# Patient Record
Sex: Female | Born: 1995 | Race: Black or African American | Hispanic: No | Marital: Single | Smoking: Never smoker
Health system: Southern US, Community
[De-identification: ages and names within clinical notes are randomized; demographics above are authoritative.]

## PROBLEM LIST (undated history)

## (undated) DIAGNOSIS — L659 Nonscarring hair loss, unspecified: Secondary | ICD-10-CM

## (undated) DIAGNOSIS — R6252 Short stature (child): Secondary | ICD-10-CM

## (undated) DIAGNOSIS — J309 Allergic rhinitis, unspecified: Secondary | ICD-10-CM

## (undated) HISTORY — DX: Short stature (child): R62.52

## (undated) HISTORY — DX: Nonscarring hair loss, unspecified: L65.9

## (undated) HISTORY — DX: Allergic rhinitis, unspecified: J30.9

## (undated) HISTORY — PX: OTHER SURGICAL HISTORY: SHX169

---
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
## 2017-01-01 ENCOUNTER — Ambulatory Visit (INDEPENDENT_AMBULATORY_CARE_PROVIDER_SITE_OTHER): Payer: BLUE CROSS/BLUE SHIELD | Admitting: *Deleted

## 2017-01-01 DIAGNOSIS — J309 Allergic rhinitis, unspecified: Secondary | ICD-10-CM

## 2017-01-11 ENCOUNTER — Ambulatory Visit (INDEPENDENT_AMBULATORY_CARE_PROVIDER_SITE_OTHER): Payer: BLUE CROSS/BLUE SHIELD

## 2017-01-11 DIAGNOSIS — J309 Allergic rhinitis, unspecified: Secondary | ICD-10-CM | POA: Diagnosis not present

## 2017-01-15 ENCOUNTER — Ambulatory Visit (INDEPENDENT_AMBULATORY_CARE_PROVIDER_SITE_OTHER): Payer: BLUE CROSS/BLUE SHIELD | Admitting: *Deleted

## 2017-01-15 DIAGNOSIS — J309 Allergic rhinitis, unspecified: Secondary | ICD-10-CM | POA: Diagnosis not present

## 2017-01-25 ENCOUNTER — Ambulatory Visit (INDEPENDENT_AMBULATORY_CARE_PROVIDER_SITE_OTHER): Payer: BLUE CROSS/BLUE SHIELD

## 2017-01-25 DIAGNOSIS — J309 Allergic rhinitis, unspecified: Secondary | ICD-10-CM

## 2017-02-21 ENCOUNTER — Ambulatory Visit (INDEPENDENT_AMBULATORY_CARE_PROVIDER_SITE_OTHER): Payer: BLUE CROSS/BLUE SHIELD | Admitting: *Deleted

## 2017-02-21 DIAGNOSIS — J309 Allergic rhinitis, unspecified: Secondary | ICD-10-CM | POA: Diagnosis not present

## 2017-03-21 ENCOUNTER — Ambulatory Visit (INDEPENDENT_AMBULATORY_CARE_PROVIDER_SITE_OTHER): Payer: BLUE CROSS/BLUE SHIELD | Admitting: *Deleted

## 2017-03-21 DIAGNOSIS — J309 Allergic rhinitis, unspecified: Secondary | ICD-10-CM | POA: Diagnosis not present

## 2017-04-24 ENCOUNTER — Encounter: Payer: Self-pay | Admitting: *Deleted

## 2017-04-24 NOTE — Progress Notes (Signed)
VIALS MADE. EXP: 04-24-18. HV 

## 2017-05-01 DIAGNOSIS — J301 Allergic rhinitis due to pollen: Secondary | ICD-10-CM | POA: Diagnosis not present

## 2017-05-10 ENCOUNTER — Ambulatory Visit (INDEPENDENT_AMBULATORY_CARE_PROVIDER_SITE_OTHER): Payer: 59

## 2017-05-10 DIAGNOSIS — J309 Allergic rhinitis, unspecified: Secondary | ICD-10-CM

## 2017-06-14 ENCOUNTER — Ambulatory Visit (INDEPENDENT_AMBULATORY_CARE_PROVIDER_SITE_OTHER): Payer: 59

## 2017-06-14 DIAGNOSIS — J309 Allergic rhinitis, unspecified: Secondary | ICD-10-CM

## 2017-07-18 ENCOUNTER — Ambulatory Visit (INDEPENDENT_AMBULATORY_CARE_PROVIDER_SITE_OTHER): Payer: 59 | Admitting: *Deleted

## 2017-07-18 DIAGNOSIS — J309 Allergic rhinitis, unspecified: Secondary | ICD-10-CM

## 2017-07-23 ENCOUNTER — Encounter: Payer: Self-pay | Admitting: Allergy and Immunology

## 2017-07-23 ENCOUNTER — Ambulatory Visit: Payer: 59 | Admitting: Allergy and Immunology

## 2017-07-23 VITALS — BP 112/76 | HR 72 | Resp 16

## 2017-07-23 DIAGNOSIS — J3089 Other allergic rhinitis: Secondary | ICD-10-CM | POA: Diagnosis not present

## 2017-07-23 NOTE — Patient Instructions (Addendum)
°  1.  Continue immunotherapy (and EpiPen) ° °2.  Return to clinic in 1 year or earlier if problem °

## 2017-07-23 NOTE — Progress Notes (Signed)
     Follow-up Note  Referring Provider: Duard BradyPudlo, Ronald J, MD Primary Provider: Duard BradyPudlo, Ronald J, MD Date of Office Visit: 07/23/2017  Subjective:   Alla GermanBryanna L Diaz (DOB: [birth year]) is a 22 y.o. female who [facility] in re-evaluation of the following:  HPI: Marissa Diaz presents to this clinic in reevaluation of her allergic rhinoconjunctivitis treated with immunotherapy.  She was last seen in this clinic over a year ago.  She has really done well and has completely eliminated all upper airway and eye issues.  Currently her immunotherapy is every 4 weeks.  She has not had any adverse effect secondary to the use of this treatment.  Allergies as of 07/23/2017   No Known Allergies     Medication List      EPIPEN 2-PAK 0.3 mg/0.3 mL Soaj injection Generic drug:  EPINEPHrine Inject into the muscle as needed.       History reviewed. No pertinent past medical history.  History reviewed. No pertinent surgical history.  Review of systems negative except as noted in HPI / PMHx or noted below:  Review of Systems  Constitutional: Negative.   HENT: Negative.   Eyes: Negative.   Respiratory: Negative.   Cardiovascular: Negative.   Gastrointestinal: Negative.   Genitourinary: Negative.   Musculoskeletal: Negative.   Skin: Negative.   Neurological: Negative.   Endo/Heme/Allergies: Negative.   Psychiatric/Behavioral: Negative.      Objective:   Vitals:   07/23/17 1636  BP: 112/76  Pulse: 72  Resp: 16          Physical Exam  Constitutional: She is well-developed, well-nourished, and in no distress.  HENT:  Head: Normocephalic.  Right Ear: Tympanic membrane, external ear and ear canal normal.  Left Ear: Tympanic membrane, external ear and ear canal normal.  Nose: Nose normal. No mucosal edema or rhinorrhea.  Mouth/Throat: Uvula is midline, oropharynx is clear and moist and mucous membranes are normal. No oropharyngeal exudate.    Eyes: Conjunctivae are normal.  Neck: Trachea normal. No tracheal tenderness present. No tracheal deviation present. No thyromegaly present.  Cardiovascular: Normal rate, regular rhythm, S1 normal, S2 normal and normal heart sounds.  No murmur heard. Pulmonary/Chest: Breath sounds normal. No stridor. No respiratory distress. She has no wheezes. She has no rales.  Musculoskeletal: She exhibits no edema.  Lymphadenopathy:       Head (right side): No tonsillar adenopathy present.       Head (left side): No tonsillar adenopathy present.    She has no cervical adenopathy.  Neurological: She is alert. Gait normal.  Skin: No rash noted. She is not diaphoretic. No erythema. Nails show no clubbing.  Psychiatric: Mood and affect normal.    Diagnostics: none  Assessment and Plan:   1. Other allergic rhinitis     1.  Continue immunotherapy and EpiPen  2.  Return to clinic in 1 year or earlier if problem  Marissa Diaz appears to be doing quite well and she will continue on immunotherapy at this point in time and I will see her back in this clinic in 1 year or earlier if there is a problem.  Laurette SchimkeEric Evelyne Makepeace, MD Allergy / Immunology [facility]

## 2017-07-24 ENCOUNTER — Encounter: Payer: Self-pay | Admitting: Allergy and Immunology

## 2017-08-22 ENCOUNTER — Ambulatory Visit (INDEPENDENT_AMBULATORY_CARE_PROVIDER_SITE_OTHER): Payer: 59 | Admitting: *Deleted

## 2017-08-22 DIAGNOSIS — J309 Allergic rhinitis, unspecified: Secondary | ICD-10-CM | POA: Diagnosis not present

## 2017-08-30 ENCOUNTER — Ambulatory Visit (INDEPENDENT_AMBULATORY_CARE_PROVIDER_SITE_OTHER): Payer: 59

## 2017-08-30 DIAGNOSIS — J309 Allergic rhinitis, unspecified: Secondary | ICD-10-CM

## 2017-09-05 ENCOUNTER — Ambulatory Visit (INDEPENDENT_AMBULATORY_CARE_PROVIDER_SITE_OTHER): Payer: 59 | Admitting: *Deleted

## 2017-09-05 DIAGNOSIS — J309 Allergic rhinitis, unspecified: Secondary | ICD-10-CM

## 2017-09-11 ENCOUNTER — Ambulatory Visit (INDEPENDENT_AMBULATORY_CARE_PROVIDER_SITE_OTHER): Payer: 59 | Admitting: *Deleted

## 2017-09-11 DIAGNOSIS — J309 Allergic rhinitis, unspecified: Secondary | ICD-10-CM | POA: Diagnosis not present

## 2017-09-20 ENCOUNTER — Ambulatory Visit (INDEPENDENT_AMBULATORY_CARE_PROVIDER_SITE_OTHER): Payer: 59

## 2017-09-20 DIAGNOSIS — J309 Allergic rhinitis, unspecified: Secondary | ICD-10-CM

## 2017-09-26 ENCOUNTER — Ambulatory Visit (INDEPENDENT_AMBULATORY_CARE_PROVIDER_SITE_OTHER): Payer: 59

## 2017-09-26 DIAGNOSIS — J309 Allergic rhinitis, unspecified: Secondary | ICD-10-CM

## 2017-10-31 ENCOUNTER — Ambulatory Visit (INDEPENDENT_AMBULATORY_CARE_PROVIDER_SITE_OTHER): Payer: 59

## 2017-10-31 DIAGNOSIS — J309 Allergic rhinitis, unspecified: Secondary | ICD-10-CM

## 2017-11-25 NOTE — Progress Notes (Signed)
HPI:  Marissa Diaz is here to establish care.   Marissa Diaz is a very pleasant 22 year old here to establish  care.  She used to see Dr. Reginia FortsPablo she was [age].  She has a past medical history significant for short stature, hair loss, eczema and seasonal allergies.  Her mother she had an extensive work-up for her short stature with both genetics specialist and endocrinology, but was told that everything was normal.  They deny any history of heart or lung disorders, irr menses, sugar disorders, thyroid disorders or any genetic mutations.  She [place] dermat[place] in [place], Dr. Burna FortsStrowd for her skin and hair issues. No medications currently. She also sees and allergist, Dr. Ardyth HarpsKazlow.  She has a history of a tonsillectomy, adenoidectomy and sinus surgery.   Reports recently she has been quite healthy with no recurrent infections or other issues currently.  ROS negative for unless reported above: fevers, unintentional weight loss, hearing or vision loss, chest pain, palpitations, struggling to breath, hemoptysis, melena, hematochezia, hematuria, falls, loc, si, thoughts of self harm  Past Medical History:  Diagnosis Date  . Allergic rhinitis    sees allergist, Dr. Lucie LeatherKozlow  . Hair loss    sees dermatologist [facility], Dr. Burna FortsStrowd  . Short stature    reports extensive evaluation with geneticist and endocrine and told no abnormalities    Past Surgical History:  Procedure Laterality Date  . EYE SURGERY  2007   left eye due to infection  . OTHER SURGICAL HISTORY     Surgery due to left arm compound fracture  . TONSILLECTOMY  2008    Family History  Problem Relation Age of Onset  . Learning disabilities Brother   . Asthma Brother   . Arthritis Maternal Grandmother   . Hyperlipidemia Maternal Grandmother   . Early death Maternal Grandfather     Social History   Socioeconomic History  . Marital status: Single    Spouse name: Not on file  . Number of children: Not on file  .  Years of education: Not on file  . Highest education level: Not on file  Occupational History  . Not on file  Social Needs  . Financial resource strain: Not on file  . Food insecurity:    Worry: Not on file    Inability: Not on file  . Transportation needs:    Medical: Not on file    Non-medical: Not on file  Tobacco Use  . Smoking status: Never Smoker  . Smokeless tobacco: Never Used  Substance and Sexual Activity  . Alcohol use: No    Frequency: Never  . Drug use: No  . Sexual activity: Never  Lifestyle  . Physical activity:    Days per week: Not on file    Minutes per session: Not on file  . Stress: Not on file  Relationships  . Social connections:    Talks on phone: Not on file    Gets together: Not on file    Attends religious service: Not on file    Active member of club or organization: Not on file    Attends meetings of clubs or organizations: Not on file    Relationship status: Not on file  Other Topics Concern  . Not on file  Social History Narrative  . Not on file     Current Outpatient Medications:  .  BLACK CURRANT SEED OIL PO, Take by mouth daily., Disp: , Rfl:  .  EPINEPHrine (EPIPEN  2-PAK) 0.3 mg/0.3 mL IJ SOAJ injection, Inject into the muscle as needed., Disp: , Rfl:  .  Multiple Vitamins-Minerals (MULTIVITAMIN ADULT PO), Take by mouth., Disp: , Rfl:  .  NON FORMULARY, Allergy injections-treated by Dr Lucie Leather, Disp: , Rfl:  .  OVER THE COUNTER MEDICATION, Ultimate Omega-2 soft gels daily, Disp: , Rfl:   EXAM:  Vitals:   11/26/17 0932  BP: 90/60  Pulse: 95  Temp: 98.6 F (37 C)    Body mass index is 17.82 kg/m.  GENERAL: vitals reviewed and listed above, alert, oriented, appears well hydrated and in no acute distress, short stature  HEENT: atraumatic, conjunttiva clear, no obvious abnormalities on inspection of external nose and ears, hair loss on top of head  NECK: no obvious masses on inspection  LUNGS: clear to auscultation  bilaterally, no wheezes, rales or rhonchi, good air movement  CV: HRRR, no peripheral edema  MS: moves all extremities without noticeable abnormality  PSYCH: pleasant and cooperative, no obvious depression or anxiety  ASSESSMENT AND PLAN:  Discussed the following assessment and plan: More than 50% of over 30 minutes spent in total in caring for this patient was spent face-to-face with the patient, counseling and/or coordinating care.   Short stature Underweight -she and mother report hx extensive evaluation with genetics and endocrine with no anormalities -stable, chronic since birth -advised 3 healthy meals and snacks and regular exercise  Hair loss -sees dermatology  Allergic rhinitis, unspecified seasonality, unspecified trigger -We reviewed the PMH, PSH, FH, SH, Meds and Allergies. -We provided refills for any medications we will prescribe as needed. -We addressed current concerns per orders and patient instructions. -We have asked for records for pertinent exams, studies, vaccines and notes from previous providers. -We have advised patient to follow up per instructions below.   -Patient advised to return or notify a doctor immediately if symptoms worsen or persist or new concerns arise.  Patient Instructions  BEFORE YOU LEAVE: -check [place] vaccine resister and transfer to Epic -please let her know if she is due for the tetanus booster -follow up: yearly for physical and as needed  Welcome to [facility]!   We recommend the following healthy lifestyle for LIFE: 1) Small portions. But, make sure to get regular (at least 3 per day), healthy meals and small healthy snacks if needed.  2) Eat a healthy clean diet.   TRY TO EAT: -at least 5-7 servings of low sugar, colorful, and nutrient rich vegetables per day (not corn, potatoes or bananas.) -berries are the best choice if you wish to eat fruit (only eat small amounts if trying to reduce weight)  -lean meets (fish, white  meat of chicken or [place]) -vegan proteins for some meals - beans or tofu, whole grains, nuts and seeds -Replace bad fats with good fats - good fats include: fish, nuts and seeds, canola oil, olive oil -small amounts of low fat or non fat dairy -small amounts of100 % whole grains - check the lables -drink plenty of water  AVOID: -SUGAR, sweets, anything with added sugar, corn syrup or sweeteners - must read labels as even foods advertised as "healthy" often are loaded with sugar -if you must have a sweetener, small amounts of stevia may be best -sweetened beverages and artificially sweetened beverages -simple starches (rice, bread, potatoes, pasta, chips, etc - small amounts of 100% whole grains are ok) -red meat, pork, butter -fried foods, fast food, processed food, excessive dairy, eggs and coconut.  3)Get at least 150  minutes of sweaty aerobic exercise per week.  4)Reduce stress - consider counseling, meditation and relaxation to balance other aspects of your life.     Terressa Koyanagi

## 2017-11-26 ENCOUNTER — Encounter: Payer: Self-pay | Admitting: Family Medicine

## 2017-11-26 ENCOUNTER — Ambulatory Visit (INDEPENDENT_AMBULATORY_CARE_PROVIDER_SITE_OTHER): Payer: Managed Care, Other (non HMO) | Admitting: Family Medicine

## 2017-11-26 VITALS — BP 90/60 | HR 95 | Temp 98.6°F | Ht <= 58 in | Wt 80.9 lb

## 2017-11-26 DIAGNOSIS — R6252 Short stature (child): Secondary | ICD-10-CM

## 2017-11-26 DIAGNOSIS — L659 Nonscarring hair loss, unspecified: Secondary | ICD-10-CM

## 2017-11-26 DIAGNOSIS — R636 Underweight: Secondary | ICD-10-CM

## 2017-11-26 DIAGNOSIS — J309 Allergic rhinitis, unspecified: Secondary | ICD-10-CM

## 2017-11-26 NOTE — Patient Instructions (Signed)
BEFORE YOU LEAVE: -check [place] vaccine resister and transfer to Epic -please let her know if she is due for the tetanus booster -follow up: yearly for physical and as needed  Welcome to [facility]!   We recommend the following healthy lifestyle for LIFE: 1) Small portions. But, make sure to get regular (at least 3 per day), healthy meals and small healthy snacks if needed.  2) Eat a healthy clean diet.   TRY TO EAT: -at least 5-7 servings of low sugar, colorful, and nutrient rich vegetables per day (not corn, potatoes or bananas.) -berries are the best choice if you wish to eat fruit (only eat small amounts if trying to reduce weight)  -lean meets (fish, white meat of chicken or [place]) -vegan proteins for some meals - beans or tofu, whole grains, nuts and seeds -Replace bad fats with good fats - good fats include: fish, nuts and seeds, canola oil, olive oil -small amounts of low fat or non fat dairy -small amounts of100 % whole grains - check the lables -drink plenty of water  AVOID: -SUGAR, sweets, anything with added sugar, corn syrup or sweeteners - must read labels as even foods advertised as "healthy" often are loaded with sugar -if you must have a sweetener, small amounts of stevia may be best -sweetened beverages and artificially sweetened beverages -simple starches (rice, bread, potatoes, pasta, chips, etc - small amounts of 100% whole grains are ok) -red meat, pork, butter -fried foods, fast food, processed food, excessive dairy, eggs and coconut.  3)Get at least 150 minutes of sweaty aerobic exercise per week.  4)Reduce stress - consider counseling, meditation and relaxation to balance other aspects of your life.

## 2017-11-28 ENCOUNTER — Ambulatory Visit (INDEPENDENT_AMBULATORY_CARE_PROVIDER_SITE_OTHER): Payer: 59 | Admitting: *Deleted

## 2017-11-28 DIAGNOSIS — J309 Allergic rhinitis, unspecified: Secondary | ICD-10-CM

## 2017-12-10 ENCOUNTER — Ambulatory Visit (INDEPENDENT_AMBULATORY_CARE_PROVIDER_SITE_OTHER): Payer: Managed Care, Other (non HMO) | Admitting: Family Medicine

## 2017-12-10 DIAGNOSIS — Z23 Encounter for immunization: Secondary | ICD-10-CM

## 2017-12-26 ENCOUNTER — Ambulatory Visit (INDEPENDENT_AMBULATORY_CARE_PROVIDER_SITE_OTHER): Payer: 59 | Admitting: *Deleted

## 2017-12-26 DIAGNOSIS — J309 Allergic rhinitis, unspecified: Secondary | ICD-10-CM

## 2018-01-01 DIAGNOSIS — J3089 Other allergic rhinitis: Secondary | ICD-10-CM | POA: Diagnosis not present

## 2018-01-01 NOTE — Progress Notes (Signed)
Vials exp 01-02-19 

## 2018-01-02 NOTE — Addendum Note (Signed)
Addended by: Aniceto BossNIMMONS, SYLVIA A on: 01/02/2018 04:58 PM   Modules accepted: Orders

## 2018-01-17 ENCOUNTER — Ambulatory Visit (INDEPENDENT_AMBULATORY_CARE_PROVIDER_SITE_OTHER): Payer: 59

## 2018-01-17 DIAGNOSIS — J309 Allergic rhinitis, unspecified: Secondary | ICD-10-CM | POA: Diagnosis not present

## 2018-03-07 ENCOUNTER — Ambulatory Visit (INDEPENDENT_AMBULATORY_CARE_PROVIDER_SITE_OTHER): Payer: 59 | Admitting: *Deleted

## 2018-03-07 DIAGNOSIS — J309 Allergic rhinitis, unspecified: Secondary | ICD-10-CM

## 2018-03-14 ENCOUNTER — Ambulatory Visit (INDEPENDENT_AMBULATORY_CARE_PROVIDER_SITE_OTHER): Payer: 59 | Admitting: *Deleted

## 2018-03-14 DIAGNOSIS — J309 Allergic rhinitis, unspecified: Secondary | ICD-10-CM

## 2018-03-21 ENCOUNTER — Ambulatory Visit (INDEPENDENT_AMBULATORY_CARE_PROVIDER_SITE_OTHER): Payer: 59 | Admitting: *Deleted

## 2018-03-21 DIAGNOSIS — J309 Allergic rhinitis, unspecified: Secondary | ICD-10-CM | POA: Diagnosis not present

## 2018-03-28 ENCOUNTER — Ambulatory Visit (INDEPENDENT_AMBULATORY_CARE_PROVIDER_SITE_OTHER): Payer: 59

## 2018-03-28 DIAGNOSIS — J309 Allergic rhinitis, unspecified: Secondary | ICD-10-CM

## 2018-04-02 ENCOUNTER — Ambulatory Visit (INDEPENDENT_AMBULATORY_CARE_PROVIDER_SITE_OTHER): Payer: 59 | Admitting: *Deleted

## 2018-04-02 DIAGNOSIS — J309 Allergic rhinitis, unspecified: Secondary | ICD-10-CM | POA: Diagnosis not present

## 2018-04-28 ENCOUNTER — Ambulatory Visit (INDEPENDENT_AMBULATORY_CARE_PROVIDER_SITE_OTHER): Payer: 59

## 2018-04-28 DIAGNOSIS — J309 Allergic rhinitis, unspecified: Secondary | ICD-10-CM | POA: Diagnosis not present

## 2018-05-27 ENCOUNTER — Ambulatory Visit (INDEPENDENT_AMBULATORY_CARE_PROVIDER_SITE_OTHER): Payer: 59 | Admitting: *Deleted

## 2018-05-27 DIAGNOSIS — J309 Allergic rhinitis, unspecified: Secondary | ICD-10-CM | POA: Diagnosis not present

## 2018-07-08 ENCOUNTER — Ambulatory Visit (INDEPENDENT_AMBULATORY_CARE_PROVIDER_SITE_OTHER): Payer: 59 | Admitting: *Deleted

## 2018-07-08 DIAGNOSIS — J309 Allergic rhinitis, unspecified: Secondary | ICD-10-CM | POA: Diagnosis not present

## 2018-07-14 NOTE — Progress Notes (Signed)
EXP 07/15/19 

## 2018-07-15 DIAGNOSIS — J309 Allergic rhinitis, unspecified: Secondary | ICD-10-CM | POA: Diagnosis not present

## 2018-08-07 ENCOUNTER — Ambulatory Visit (INDEPENDENT_AMBULATORY_CARE_PROVIDER_SITE_OTHER): Payer: Managed Care, Other (non HMO) | Admitting: *Deleted

## 2018-08-07 DIAGNOSIS — J309 Allergic rhinitis, unspecified: Secondary | ICD-10-CM

## 2018-09-05 ENCOUNTER — Ambulatory Visit (INDEPENDENT_AMBULATORY_CARE_PROVIDER_SITE_OTHER): Payer: Managed Care, Other (non HMO) | Admitting: *Deleted

## 2018-09-05 DIAGNOSIS — J309 Allergic rhinitis, unspecified: Secondary | ICD-10-CM | POA: Diagnosis not present

## 2018-10-03 ENCOUNTER — Ambulatory Visit (INDEPENDENT_AMBULATORY_CARE_PROVIDER_SITE_OTHER): Payer: Managed Care, Other (non HMO) | Admitting: *Deleted

## 2018-10-03 DIAGNOSIS — J309 Allergic rhinitis, unspecified: Secondary | ICD-10-CM

## 2018-10-09 ENCOUNTER — Ambulatory Visit (INDEPENDENT_AMBULATORY_CARE_PROVIDER_SITE_OTHER): Payer: Managed Care, Other (non HMO)

## 2018-10-09 DIAGNOSIS — J309 Allergic rhinitis, unspecified: Secondary | ICD-10-CM

## 2018-10-16 ENCOUNTER — Ambulatory Visit (INDEPENDENT_AMBULATORY_CARE_PROVIDER_SITE_OTHER): Payer: Managed Care, Other (non HMO) | Admitting: *Deleted

## 2018-10-16 DIAGNOSIS — J309 Allergic rhinitis, unspecified: Secondary | ICD-10-CM | POA: Diagnosis not present

## 2018-10-23 ENCOUNTER — Ambulatory Visit (INDEPENDENT_AMBULATORY_CARE_PROVIDER_SITE_OTHER): Payer: Managed Care, Other (non HMO)

## 2018-10-23 DIAGNOSIS — J309 Allergic rhinitis, unspecified: Secondary | ICD-10-CM

## 2018-10-30 ENCOUNTER — Ambulatory Visit (INDEPENDENT_AMBULATORY_CARE_PROVIDER_SITE_OTHER): Payer: Managed Care, Other (non HMO) | Admitting: *Deleted

## 2018-10-30 DIAGNOSIS — J309 Allergic rhinitis, unspecified: Secondary | ICD-10-CM | POA: Diagnosis not present

## 2018-11-26 ENCOUNTER — Ambulatory Visit (INDEPENDENT_AMBULATORY_CARE_PROVIDER_SITE_OTHER): Payer: Managed Care, Other (non HMO) | Admitting: *Deleted

## 2018-11-26 DIAGNOSIS — J309 Allergic rhinitis, unspecified: Secondary | ICD-10-CM

## 2018-12-23 ENCOUNTER — Ambulatory Visit (INDEPENDENT_AMBULATORY_CARE_PROVIDER_SITE_OTHER): Payer: Managed Care, Other (non HMO) | Admitting: *Deleted

## 2018-12-23 DIAGNOSIS — J309 Allergic rhinitis, unspecified: Secondary | ICD-10-CM

## 2019-01-02 ENCOUNTER — Telehealth: Payer: Self-pay | Admitting: *Deleted

## 2019-01-02 ENCOUNTER — Encounter: Payer: Self-pay | Admitting: Family Medicine

## 2019-01-02 ENCOUNTER — Ambulatory Visit (INDEPENDENT_AMBULATORY_CARE_PROVIDER_SITE_OTHER): Payer: Managed Care, Other (non HMO) | Admitting: Family Medicine

## 2019-01-02 ENCOUNTER — Other Ambulatory Visit: Payer: Self-pay

## 2019-01-02 DIAGNOSIS — H1010 Acute atopic conjunctivitis, unspecified eye: Secondary | ICD-10-CM

## 2019-01-02 DIAGNOSIS — Z13220 Encounter for screening for lipoid disorders: Secondary | ICD-10-CM | POA: Diagnosis not present

## 2019-01-02 DIAGNOSIS — J309 Allergic rhinitis, unspecified: Secondary | ICD-10-CM

## 2019-01-02 DIAGNOSIS — Z131 Encounter for screening for diabetes mellitus: Secondary | ICD-10-CM

## 2019-01-02 NOTE — Telephone Encounter (Signed)
-----   Message from Caren Macadam, MD sent at 01/02/2019  1:51 PM EDT ----- Please send her link for mychart: [email or web address]

## 2019-01-02 NOTE — Progress Notes (Signed)
Virtual Visit via Video Note  I connected with Marissa Diaz   on 01/02/19 at  1:30 PM EDT by a video enabled telemedicine application and verified that I am speaking with the correct person using two identifiers.  Location patient: home Location provider:work office Persons participating in the virtual visit: patient, provider  I discussed the limitations of evaluation and management by telemedicine and the availability of in person appointments. The patient expressed understanding and agreed to proceed.   Marissa Diaz DOB: [birth year] Encounter date: 01/02/2019  This is a 23 y.o. female who presents to establish care. No chief complaint on file.   History of present illness: Has 2 questions: Wondering if she needed TB testing.   Allergies:does allergy shots which help her. Seeing Dr. Neldon Mc for this.   Likes to watch sports - basketball, football.   Does walk regularly at park. Tries to do 2 miles close to every day. Eats pretty healthy diet overall.   She is starting job at daycare soon. Trying to get things in order for this. 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  No joint pain or back pain.    Past Medical History:  Diagnosis Date  . Allergic rhinitis    sees allergist, Dr. Neldon Mc  . Hair loss    sees dermatologist [facility], Dr. Shaaron Adler  . Short stature    reports extensive evaluation with geneticist and endocrine and told no abnormalities   Past Surgical History:  Procedure Laterality Date  . EYE SURGERY  2007   left eye due to infection  . OTHER SURGICAL HISTORY     Surgery due to left arm compound fracture  . TONSILLECTOMY  2008   Allergies  Allergen Reactions  . Grass Extracts [Gramineae Pollens] Other (See Comments)    sneezing  . Tree Extract Other (See Comments)    sneezing   Current Meds  Medication Sig  . BLACK CURRANT SEED OIL PO Take by mouth daily.  Marland Kitchen EPINEPHrine (EPIPEN 2-PAK) 0.3 mg/0.3 mL IJ SOAJ injection Inject into the muscle  as needed.  . Multiple Vitamins-Minerals (MULTIVITAMIN ADULT PO) Take by mouth.  . NON FORMULARY Allergy injections-treated by Dr Neldon Mc  . OVER THE COUNTER MEDICATION Ultimate Omega-2 soft gels daily  . OVER THE COUNTER MEDICATION Fiber supplement   Social History   Tobacco Use  . Smoking status: Never Smoker  . Smokeless tobacco: Never Used  Substance Use Topics  . Alcohol use: No    Frequency: Never   Family History  Problem Relation Age of Onset  . Allergic rhinitis Mother   . Healthy Father   . Learning disabilities Brother   . Asthma Brother   . Arthritis Maternal Grandmother   . Hyperlipidemia Maternal Grandmother   . Thyroid disease Maternal Grandmother   . Colon polyps Maternal Grandmother   . Early death Maternal Grandfather        renal failure     Review of Systems  Constitutional: Negative for chills, fatigue and fever.  Respiratory: Negative for cough, chest tightness, shortness of breath and wheezing.   Cardiovascular: Negative for chest pain, palpitations and leg swelling.  Musculoskeletal: Negative for arthralgias, back pain and joint swelling.  Psychiatric/Behavioral: Negative for agitation and decreased concentration. The patient is not nervous/anxious.     Objective:  There were no vitals taken for this visit.      BP Readings from Last 3 Encounters:  11/26/17 90/60  07/23/17 112/76   Wt Readings from  Last 3 Encounters:  11/26/17 80 lb 14.4 oz (36.7 kg)    EXAM:  GENERAL: alert, oriented, appears well and in no acute distress  HEENT: atraumatic, conjunctiva clear, no obvious abnormalities on inspection of external nose and ears  NECK: normal movements of the head and neck  LUNGS: on inspection no signs of respiratory distress, breathing rate appears normal, no obvious gross SOB, gasping or wheezing  CV: no obvious cyanosis  MS: moves all visible extremities without noticeable abnormality  PSYCH/NEURO: pleasant and cooperative, no  obvious depression or anxiety, speech and thought processing grossly intact  SKIN: no facial or neck abnormalities noted.   Assessment/Plan 1. Allergic rhinoconjunctivitis Controlled with allergy shots.   I have asked her to contact daycare for health form. I assume there is one needed since she is asking about TB. Would like to make sure we get everything ordered that is needed at one time (ie titers). I have messaged JoAnne to get her set up with mychart so that she can send back needed forms/information to me.   2. Screening for lipids/screening for diabetes: can get this bloodwork when in office next. Will add on any needed bloodwork (see above).    I discussed the assessment and treatment plan with the patient. The patient was provided an opportunity to ask questions and all were answered. The patient agreed with the plan and demonstrated an understanding of the instructions.   The patient was advised to call back or seek an in-person evaluation if the symptoms worsen or if the condition fails to improve as anticipated.  I provided 20 minutes of non-face-to-face time during this encounter.   Theodis ShoveJunell Koberlein, MD

## 2019-01-02 NOTE — Telephone Encounter (Signed)
Link sent to email below.

## 2019-01-20 ENCOUNTER — Ambulatory Visit (INDEPENDENT_AMBULATORY_CARE_PROVIDER_SITE_OTHER): Payer: Managed Care, Other (non HMO) | Admitting: *Deleted

## 2019-01-20 DIAGNOSIS — J309 Allergic rhinitis, unspecified: Secondary | ICD-10-CM

## 2019-02-19 ENCOUNTER — Ambulatory Visit (INDEPENDENT_AMBULATORY_CARE_PROVIDER_SITE_OTHER): Payer: Managed Care, Other (non HMO)

## 2019-02-19 DIAGNOSIS — J309 Allergic rhinitis, unspecified: Secondary | ICD-10-CM | POA: Diagnosis not present

## 2019-02-25 NOTE — Progress Notes (Signed)
VIALS EXP 02-25-20 

## 2019-02-26 DIAGNOSIS — J301 Allergic rhinitis due to pollen: Secondary | ICD-10-CM

## 2019-03-19 ENCOUNTER — Ambulatory Visit (INDEPENDENT_AMBULATORY_CARE_PROVIDER_SITE_OTHER): Payer: Managed Care, Other (non HMO) | Admitting: *Deleted

## 2019-03-19 DIAGNOSIS — J309 Allergic rhinitis, unspecified: Secondary | ICD-10-CM

## 2019-04-23 ENCOUNTER — Ambulatory Visit (INDEPENDENT_AMBULATORY_CARE_PROVIDER_SITE_OTHER): Payer: Managed Care, Other (non HMO)

## 2019-04-23 DIAGNOSIS — J309 Allergic rhinitis, unspecified: Secondary | ICD-10-CM | POA: Diagnosis not present

## 2019-05-21 ENCOUNTER — Ambulatory Visit (INDEPENDENT_AMBULATORY_CARE_PROVIDER_SITE_OTHER): Payer: BC Managed Care – PPO

## 2019-05-21 DIAGNOSIS — J309 Allergic rhinitis, unspecified: Secondary | ICD-10-CM

## 2019-05-29 ENCOUNTER — Ambulatory Visit (INDEPENDENT_AMBULATORY_CARE_PROVIDER_SITE_OTHER): Payer: BC Managed Care – PPO | Admitting: *Deleted

## 2019-05-29 DIAGNOSIS — J309 Allergic rhinitis, unspecified: Secondary | ICD-10-CM | POA: Diagnosis not present

## 2019-06-02 ENCOUNTER — Ambulatory Visit (INDEPENDENT_AMBULATORY_CARE_PROVIDER_SITE_OTHER): Payer: BC Managed Care – PPO

## 2019-06-02 DIAGNOSIS — J309 Allergic rhinitis, unspecified: Secondary | ICD-10-CM | POA: Diagnosis not present

## 2019-06-10 ENCOUNTER — Ambulatory Visit (INDEPENDENT_AMBULATORY_CARE_PROVIDER_SITE_OTHER): Payer: BC Managed Care – PPO

## 2019-06-10 DIAGNOSIS — J309 Allergic rhinitis, unspecified: Secondary | ICD-10-CM | POA: Diagnosis not present

## 2019-06-17 ENCOUNTER — Ambulatory Visit (INDEPENDENT_AMBULATORY_CARE_PROVIDER_SITE_OTHER): Payer: BC Managed Care – PPO

## 2019-06-17 DIAGNOSIS — J309 Allergic rhinitis, unspecified: Secondary | ICD-10-CM

## 2019-07-11 ENCOUNTER — Ambulatory Visit: Payer: Self-pay | Attending: Internal Medicine

## 2019-07-11 DIAGNOSIS — Z23 Encounter for immunization: Secondary | ICD-10-CM | POA: Insufficient documentation

## 2019-07-11 NOTE — Progress Notes (Signed)
   Covid-19 Vaccination Clinic  Name:  Marissa Diaz    MRN: [id number] DOB: October 12, 1995  07/11/2019  [name] was observed post Covid-19 immunization for 15 minutes without incident. She was provided with Vaccine Information Sheet and instruction to access the V-Safe system.   [name] was instructed to call 911 with any severe reactions post vaccine: Marland Kitchen Difficulty breathing  . Swelling of face and throat  . A fast heartbeat  . A bad rash all over body  . Dizziness and weakness   Immunizations Administered    Name Date Dose VIS Date Route   Pfizer COVID-19 Vaccine 07/11/2019  8:43 AM 0.3 mL 04/17/2019 Intramuscular   Manufacturer: [employer], [employer]   Lot: [id number]   NDC: [address]-6

## 2019-07-16 ENCOUNTER — Ambulatory Visit (INDEPENDENT_AMBULATORY_CARE_PROVIDER_SITE_OTHER): Payer: BC Managed Care – PPO

## 2019-07-16 DIAGNOSIS — J309 Allergic rhinitis, unspecified: Secondary | ICD-10-CM | POA: Diagnosis not present

## 2019-08-01 ENCOUNTER — Ambulatory Visit: Payer: Self-pay | Attending: Internal Medicine

## 2019-08-01 DIAGNOSIS — Z23 Encounter for immunization: Secondary | ICD-10-CM

## 2019-08-01 NOTE — Progress Notes (Signed)
   Covid-19 Vaccination Clinic  Name:  Marissa Diaz    MRN: [id number] DOB: November 30, 1995  08/01/2019  Marissa Diaz was observed post Covid-19 immunization for 15 minutes without incident. She was provided with Vaccine Information Sheet and instruction to access the V-Safe system.   Marissa Diaz was instructed to call 911 with any severe reactions post vaccine: Marland Kitchen Difficulty breathing  . Swelling of face and throat  . A fast heartbeat  . A bad rash all over body  . Dizziness and weakness   Immunizations Administered    Name Date Dose VIS Date Route   Pfizer COVID-19 Vaccine 08/01/2019  8:36 AM 0.3 mL 04/17/2019 Intramuscular   Manufacturer: [employer], [employer]   Lot: [id number]   NDC: [address]-7

## 2019-08-11 ENCOUNTER — Ambulatory Visit (INDEPENDENT_AMBULATORY_CARE_PROVIDER_SITE_OTHER): Payer: BC Managed Care – PPO

## 2019-08-11 DIAGNOSIS — J309 Allergic rhinitis, unspecified: Secondary | ICD-10-CM

## 2019-09-10 ENCOUNTER — Ambulatory Visit (INDEPENDENT_AMBULATORY_CARE_PROVIDER_SITE_OTHER): Payer: BC Managed Care – PPO

## 2019-09-10 DIAGNOSIS — J309 Allergic rhinitis, unspecified: Secondary | ICD-10-CM | POA: Diagnosis not present

## 2019-10-08 ENCOUNTER — Ambulatory Visit (INDEPENDENT_AMBULATORY_CARE_PROVIDER_SITE_OTHER): Payer: BC Managed Care – PPO

## 2019-10-08 DIAGNOSIS — J309 Allergic rhinitis, unspecified: Secondary | ICD-10-CM

## 2019-11-02 NOTE — Progress Notes (Signed)
VIALS EXP 11-01-20 

## 2019-11-04 DIAGNOSIS — J3089 Other allergic rhinitis: Secondary | ICD-10-CM

## 2019-11-05 ENCOUNTER — Ambulatory Visit (INDEPENDENT_AMBULATORY_CARE_PROVIDER_SITE_OTHER): Payer: BC Managed Care – PPO

## 2019-11-05 DIAGNOSIS — J309 Allergic rhinitis, unspecified: Secondary | ICD-10-CM | POA: Diagnosis not present

## 2019-11-16 ENCOUNTER — Telehealth: Payer: Self-pay | Admitting: Allergy and Immunology

## 2019-11-16 NOTE — Telephone Encounter (Signed)
Called patient to schedule office visit for insurance purposes. Patient states she would have to talk to her mother before scheduling. Patient will call back to schedule.

## 2019-12-03 ENCOUNTER — Ambulatory Visit (INDEPENDENT_AMBULATORY_CARE_PROVIDER_SITE_OTHER): Payer: BC Managed Care – PPO

## 2019-12-03 DIAGNOSIS — J309 Allergic rhinitis, unspecified: Secondary | ICD-10-CM | POA: Diagnosis not present

## 2019-12-17 ENCOUNTER — Ambulatory Visit: Payer: Self-pay

## 2019-12-29 ENCOUNTER — Ambulatory Visit (INDEPENDENT_AMBULATORY_CARE_PROVIDER_SITE_OTHER): Payer: BC Managed Care – PPO

## 2019-12-29 DIAGNOSIS — J309 Allergic rhinitis, unspecified: Secondary | ICD-10-CM

## 2020-01-05 ENCOUNTER — Ambulatory Visit (INDEPENDENT_AMBULATORY_CARE_PROVIDER_SITE_OTHER): Payer: BC Managed Care – PPO

## 2020-01-05 DIAGNOSIS — J309 Allergic rhinitis, unspecified: Secondary | ICD-10-CM | POA: Diagnosis not present

## 2020-01-14 ENCOUNTER — Ambulatory Visit (INDEPENDENT_AMBULATORY_CARE_PROVIDER_SITE_OTHER): Payer: 59

## 2020-01-14 DIAGNOSIS — J309 Allergic rhinitis, unspecified: Secondary | ICD-10-CM | POA: Diagnosis not present

## 2020-01-21 ENCOUNTER — Ambulatory Visit (INDEPENDENT_AMBULATORY_CARE_PROVIDER_SITE_OTHER): Payer: 59

## 2020-01-21 DIAGNOSIS — J309 Allergic rhinitis, unspecified: Secondary | ICD-10-CM | POA: Diagnosis not present

## 2020-01-28 ENCOUNTER — Ambulatory Visit (INDEPENDENT_AMBULATORY_CARE_PROVIDER_SITE_OTHER): Payer: 59 | Admitting: *Deleted

## 2020-01-28 DIAGNOSIS — J309 Allergic rhinitis, unspecified: Secondary | ICD-10-CM

## 2020-02-25 ENCOUNTER — Ambulatory Visit (INDEPENDENT_AMBULATORY_CARE_PROVIDER_SITE_OTHER): Payer: 59

## 2020-02-25 DIAGNOSIS — J309 Allergic rhinitis, unspecified: Secondary | ICD-10-CM

## 2020-03-24 ENCOUNTER — Ambulatory Visit (INDEPENDENT_AMBULATORY_CARE_PROVIDER_SITE_OTHER): Payer: 59 | Admitting: *Deleted

## 2020-03-24 DIAGNOSIS — J309 Allergic rhinitis, unspecified: Secondary | ICD-10-CM | POA: Diagnosis not present

## 2020-04-21 ENCOUNTER — Ambulatory Visit (INDEPENDENT_AMBULATORY_CARE_PROVIDER_SITE_OTHER): Payer: 59

## 2020-04-21 DIAGNOSIS — J309 Allergic rhinitis, unspecified: Secondary | ICD-10-CM

## 2020-05-11 ENCOUNTER — Encounter: Payer: Self-pay | Admitting: Family Medicine

## 2020-05-11 ENCOUNTER — Ambulatory Visit (INDEPENDENT_AMBULATORY_CARE_PROVIDER_SITE_OTHER): Payer: 59 | Admitting: Family Medicine

## 2020-05-11 ENCOUNTER — Other Ambulatory Visit: Payer: Self-pay

## 2020-05-11 VITALS — BP 84/60 | HR 118 | Temp 99.2°F | Ht <= 58 in | Wt 79.0 lb

## 2020-05-11 DIAGNOSIS — R768 Other specified abnormal immunological findings in serum: Secondary | ICD-10-CM

## 2020-05-11 DIAGNOSIS — Z131 Encounter for screening for diabetes mellitus: Secondary | ICD-10-CM | POA: Diagnosis not present

## 2020-05-11 DIAGNOSIS — L659 Nonscarring hair loss, unspecified: Secondary | ICD-10-CM

## 2020-05-11 DIAGNOSIS — N924 Excessive bleeding in the premenopausal period: Secondary | ICD-10-CM | POA: Diagnosis not present

## 2020-05-11 DIAGNOSIS — R7689 Other specified abnormal immunological findings in serum: Secondary | ICD-10-CM

## 2020-05-11 DIAGNOSIS — Z0000 Encounter for general adult medical examination without abnormal findings: Secondary | ICD-10-CM

## 2020-05-11 DIAGNOSIS — Z13220 Encounter for screening for lipoid disorders: Secondary | ICD-10-CM | POA: Diagnosis not present

## 2020-05-11 DIAGNOSIS — K141 Geographic tongue: Secondary | ICD-10-CM | POA: Diagnosis not present

## 2020-05-11 DIAGNOSIS — Z111 Encounter for screening for respiratory tuberculosis: Secondary | ICD-10-CM

## 2020-05-11 DIAGNOSIS — Z8349 Family history of other endocrine, nutritional and metabolic diseases: Secondary | ICD-10-CM

## 2020-05-11 LAB — COMPREHENSIVE METABOLIC PANEL
ALT: 10 U/L (ref 0–35)
AST: 19 U/L (ref 0–37)
Albumin: 4.8 g/dL (ref 3.5–5.2)
Alkaline Phosphatase: 77 U/L (ref 39–117)
BUN: 15 mg/dL (ref 6–23)
CO2: 23 mEq/L (ref 19–32)
Calcium: 9.9 mg/dL (ref 8.4–10.5)
Chloride: 101 mEq/L (ref 96–112)
Creatinine, Ser: 0.72 mg/dL (ref 0.40–1.20)
GFR: 118.64 mL/min (ref >60.00)
Glucose, Bld: 77 mg/dL (ref 70–99)
Potassium: 4 mEq/L (ref 3.5–5.1)
Sodium: 136 mEq/L (ref 135–145)
Total Bilirubin: 0.3 mg/dL (ref 0.2–1.2)
Total Protein: 8.2 g/dL (ref 6.0–8.3)

## 2020-05-11 LAB — LIPID PANEL
Cholesterol: 140 mg/dL (ref 0–200)
HDL: 60.4 mg/dL (ref >39.00)
LDL Cholesterol: 62 mg/dL (ref 0–99)
NonHDL: 79.78
Total CHOL/HDL Ratio: 2
Triglycerides: 88 mg/dL (ref 0.0–149.0)
VLDL: 17.6 mg/dL (ref 0.0–40.0)

## 2020-05-11 LAB — CBC WITH DIFFERENTIAL/PLATELET
Basophils Absolute: 0 10*3/uL (ref 0.0–0.1)
Basophils Relative: 0.4 % (ref 0.0–3.0)
Eosinophils Absolute: 0 10*3/uL (ref 0.0–0.7)
Eosinophils Relative: 0.1 % (ref 0.0–5.0)
HCT: 36 % (ref 36.0–46.0)
Hemoglobin: 11.5 g/dL — ABNORMAL LOW (ref 12.0–15.0)
Lymphocytes Relative: 13.8 % (ref 12.0–46.0)
Lymphs Abs: 0.8 10*3/uL (ref 0.7–4.0)
MCHC: 32.1 g/dL (ref 30.0–36.0)
MCV: 75.6 fl — ABNORMAL LOW (ref 78.0–100.0)
Monocytes Absolute: 0.6 10*3/uL (ref 0.1–1.0)
Monocytes Relative: 11 % (ref 3.0–12.0)
Neutro Abs: 4.4 10*3/uL (ref 1.4–7.7)
Neutrophils Relative %: 74.7 % (ref 43.0–77.0)
Platelets: 334 10*3/uL (ref 150.0–400.0)
RBC: 4.76 Mil/uL (ref 3.87–5.11)
RDW: 16.9 % — ABNORMAL HIGH (ref 11.5–15.5)
WBC: 5.8 10*3/uL (ref 4.0–10.5)

## 2020-05-11 LAB — VITAMIN B12: Vitamin B-12: 1045 pg/mL — ABNORMAL HIGH (ref 211–911)

## 2020-05-11 LAB — VITAMIN D 25 HYDROXY (VIT D DEFICIENCY, FRACTURES): VITD: 11.86 ng/mL — ABNORMAL LOW (ref 30.00–100.00)

## 2020-05-11 LAB — T4, FREE: Free T4: 0.77 ng/dL (ref 0.60–1.60)

## 2020-05-11 LAB — T3, FREE: T3, Free: 3.1 pg/mL (ref 2.3–4.2)

## 2020-05-11 LAB — TSH: TSH: 1.12 u[IU]/mL (ref 0.35–4.50)

## 2020-05-11 LAB — SEDIMENTATION RATE: Sed Rate: 23 mm/hr — ABNORMAL HIGH (ref 0–20)

## 2020-05-11 NOTE — Progress Notes (Signed)
Kenleigh Toback DOB: 07-09-1995 Encounter date: 05/11/2020  This is a 25 y.o. female who presents for complete physical   History of present illness/Additional concerns:  Allergies:does allergy shots which help her. Seeing Dr. Neldon Mc for this.   Does walk regularly at park. Tries to do 2 miles close to every day. Eats pretty healthy diet overall.   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 Trying to get things in order for this.   No joint pain or back pain.   No history of joint pain or swelling.  Mom would like to get thyroid checked; does some gulping; when younger they thought it was tonsil issue and Tonsils and adenoids were removed. Not sure if sinus/PND related. Lexis notes it as well. No trouble with swallowing. Mom thinks she avoids certain foods because they are harder to swallow - since childhood. Azyiah doesn't notice this.   Has had issues with hair loss in last 6-7 years; tried derm in [place] but didn't help. Grows back in but falls out more easily. Had to start wearing wigs. Gets film over scalp. They stopped with braiding for awhile; but with wig now loosely braided. No chemicals in hair in ten years. Still with same pattern of loss top/front of scalp and temples. When it started she had ringworm and was put on oral medication and that seemed like start of problem. Eczema has calmed down over years. Was worse on legs. Uses almond oil for this. On scalp now using tea tree oil; hair growth oil, flax seed oil. Takes hair, skin and nail gummy.   Does have regular periods. Last 5 days. Changes q 2 hours on heavier days - usually first day. Gets sick to point of not functioning (cramping) on first day. Nauseated.   Energy level is pretty good overall per patient.   Past Medical History:  Diagnosis Date  . Allergic rhinitis    sees allergist, Dr. Neldon Mc  . Hair loss    sees dermatologist at [facility], Dr. Shaaron Adler  . Short stature    reports extensive  evaluation with geneticist and endocrine and told no abnormalities   Past Surgical History:  Procedure Laterality Date  . EYE SURGERY  2007   left eye due to infection  . OTHER SURGICAL HISTORY     Surgery due to left arm compound fracture  . TONSILLECTOMY  2008   Allergies  Allergen Reactions  . Grass Extracts [Gramineae Pollens] Other (See Comments)    sneezing  . Tree Extract Other (See Comments)    sneezing   Current Meds  Medication Sig  . BLACK CURRANT SEED OIL PO Take by mouth daily.  Marland Kitchen EPINEPHrine 0.3 mg/0.3 mL IJ SOAJ injection Inject into the muscle as needed.  . Multiple Vitamins-Minerals (MULTIVITAMIN ADULT PO) Take by mouth.  . NON FORMULARY Allergy injections-treated by Dr Neldon Mc  . OVER THE COUNTER MEDICATION Ultimate Omega-2 soft gels daily  . OVER THE COUNTER MEDICATION Fiber supplement   Social History   Tobacco Use  . Smoking status: Never Smoker  . Smokeless tobacco: Never Used  Substance Use Topics  . Alcohol use: No   Family History  Problem Relation Age of Onset  . Allergic rhinitis Mother   . Healthy Father   . Learning disabilities Brother   . Asthma Brother   . Arthritis Maternal Grandmother   . Hyperlipidemia Maternal Grandmother   . Thyroid disease Maternal Grandmother   . Colon polyps Maternal Grandmother   .  Early death Maternal Grandfather        renal failure     Review of Systems  Constitutional: Negative for activity change, appetite change, chills, fatigue, fever and unexpected weight change.  HENT: Negative for congestion, ear pain, hearing loss, sinus pressure, sinus pain, sore throat and trouble swallowing.   Eyes: Negative for pain and visual disturbance.  Respiratory: Negative for cough, chest tightness, shortness of breath and wheezing.   Cardiovascular: Negative for chest pain, palpitations and leg swelling.  Gastrointestinal: Negative for abdominal pain, blood in stool, constipation, diarrhea, nausea and vomiting.   Genitourinary: Negative for difficulty urinating and menstrual problem.  Musculoskeletal: Negative for arthralgias and back pain.  Skin: Negative for rash.  Neurological: Negative for dizziness, weakness, numbness and headaches.  Hematological: Negative for adenopathy. Does not bruise/bleed easily.  Psychiatric/Behavioral: Negative for agitation, decreased concentration, sleep disturbance and suicidal ideas. The patient is not nervous/anxious.        No history of anxiety or depression.    CBC:  Lab Results  Component Value Date   WBC 13.4 04/10/2007   HGB 13.4 04/10/2007   HCT 39.2 04/10/2007   MCHC 34.2 04/10/2007   RDW 12.3 04/10/2007   PLT 349 04/10/2007   CMP: Lab Results  Component Value Date   NA 131 (L) 04/10/2007   K 4.7 SLIGHT HEMOLYSIS 04/10/2007   CL 96 04/10/2007   CO2 23 04/10/2007   GLUCOSE 124 (H) 04/10/2007   BUN 13 04/10/2007   CREATININE 0.50 04/10/2007   GFRAA  04/10/2007    NOT CALCULATED        The eGFR has been calculated using the MDRD equation. This calculation has not been validated in all clinical   CALCIUM 9.8 04/10/2007   LIPID:No results found for: CHOL, TRIG, HDL, LDLCALC, LABVLDL  Objective:  BP (!) 84/60 (BP Location: Left Arm, Patient Position: Sitting, Cuff Size: Normal)   Pulse (!) 118   Temp 99.2 F (37.3 C) (Oral)   Ht _0  (1.422 m)   Wt 79 lb (35.8 kg)   LMP 04/10/2020 (Approximate)   SpO2 99%   BMI 17.71 kg/m   Weight: 79 lb (35.8 kg)   BP Readings from Last 3 Encounters:  05/11/20 (!) 84/60  11/26/17 90/60  07/23/17 112/76   Wt Readings from Last 3 Encounters:  05/11/20 79 lb (35.8 kg)  11/26/17 80 lb 14.4 oz (36.7 kg)    Physical Exam Constitutional:      General: She is not in acute distress.    Appearance: She is well-developed and well-nourished.     Comments: She is petite  HENT:     Head: Normocephalic and atraumatic.     Right Ear: External ear normal.     Left Ear: External ear normal.      Mouth/Throat:     Mouth: Oropharynx is clear and moist.     Pharynx: No oropharyngeal exudate.     Comments: Geographic tongue Eyes:     Conjunctiva/sclera: Conjunctivae normal.     Pupils: Pupils are equal, round, and reactive to light.  Neck:     Thyroid: No thyromegaly.  Cardiovascular:     Rate and Rhythm: Normal rate and regular rhythm.     Heart sounds: Normal heart sounds. No murmur heard. No friction rub. No gallop.   Pulmonary:     Effort: Pulmonary effort is normal.     Breath sounds: Normal breath sounds.  Abdominal:     General: Bowel sounds are  normal. There is no distension.     Palpations: Abdomen is soft. There is no mass.     Tenderness: There is no abdominal tenderness. There is no guarding.     Hernia: No hernia is present.  Musculoskeletal:        General: No tenderness, deformity or edema. Normal range of motion.     Cervical back: Normal range of motion and neck supple.  Lymphadenopathy:     Cervical: No cervical adenopathy.  Skin:    General: Skin is warm and dry.     Findings: No rash.     Comments: Significant hair thinning anterior scalp to temples.  There are areas where hair follicles do not appear visible.  Neurological:     Mental Status: She is alert and oriented to person, place, and time.     Deep Tendon Reflexes: Strength normal. Reflexes normal.     Reflex Scores:      Tricep reflexes are 2+ on the right side and 2+ on the left side.      Bicep reflexes are 2+ on the right side and 2+ on the left side.      Brachioradialis reflexes are 2+ on the right side and 2+ on the left side.      Patellar reflexes are 2+ on the right side and 2+ on the left side. Psychiatric:        Mood and Affect: Mood and affect normal.        Speech: Speech normal.        Behavior: Behavior normal.        Thought Content: Thought content normal.     Assessment/Plan: Health Maintenance Due  Topic Date Due  . PAP-Cervical Cytology Screening  Never done  .  PAP SMEAR-Modifier  Never done  . INFLUENZA VACCINE  Never done   Health Maintenance reviewed -she is due for Pap. We will complete other bloodwork, eval from today and consider this at follow up visit..  1. Preventative health care She stays active and eats well.  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  2. Excessive bleeding in premenopausal period She does have heavier menses.  We will check some blood work today. - Iron, TIBC and Ferritin Panel; Future - T4, free; Future - T3, free; Future - TSH; Future - CBC with Differential/Platelet; Future - Iron, TIBC and Ferritin Panel - T4, free - T3, free - TSH - CBC with Differential/Platelet  3. Hair loss This is ongoing.  It has been a number of years since she has had any blood work evaluation.  We will start with this.  She has had some regrowth, but hairline is still significantly diminished.  She does appear to have some scarring/lack of follicles on the hairline as well. - Vitamin B12; Future - VITAMIN D 25 Hydroxy (Vit-D Deficiency, Fractures); Future - Vitamin B12 - VITAMIN D 25 Hydroxy (Vit-D Deficiency, Fractures)  4. Lipid screening - Lipid panel; Future - Lipid panel  5. Family history of thyroid disease - Thyroid antibodies; Future - Thyroid antibodies  6. Screening-pulmonary TB Screening so that she can work in [employer]. - QuantiFERON-TB Gold Plus; Future  7. Screening for diabetes mellitus - Comprehensive metabolic panel; Future - Comprehensive metabolic panel  8. Geographic tongue - Sedimentation rate; Future - ANA; Future - ANA - Sedimentation rate  Return for pending bloodwork.  Micheline Rough, MD

## 2020-05-13 ENCOUNTER — Other Ambulatory Visit: Payer: 59

## 2020-05-13 DIAGNOSIS — Z111 Encounter for screening for respiratory tuberculosis: Secondary | ICD-10-CM

## 2020-05-14 LAB — QUANTIFERON-TB GOLD PLUS

## 2020-05-16 ENCOUNTER — Telehealth: Payer: Self-pay | Admitting: Family Medicine

## 2020-05-16 LAB — THYROID ANTIBODIES
Thyroglobulin Ab: <1 IU/mL (ref <=1)
Thyroperoxidase Ab SerPl-aCnc: 1 IU/mL (ref <9)

## 2020-05-16 LAB — IRON,TIBC AND FERRITIN PANEL
%SAT: 3 % (calc) — ABNORMAL LOW (ref 16–45)
Ferritin: 5 ng/mL — ABNORMAL LOW (ref 16–154)
Iron: 15 ug/dL — ABNORMAL LOW (ref 40–190)
TIBC: 527 mcg/dL (calc) — ABNORMAL HIGH (ref 250–450)

## 2020-05-16 LAB — ANTI-NUCLEAR AB-TITER (ANA TITER): ANA Titer 1: 1:320 {titer} — ABNORMAL HIGH

## 2020-05-16 LAB — QUANTIFERON-TB GOLD PLUS

## 2020-05-16 LAB — ANA: Anti Nuclear Antibody (ANA): POSITIVE — AB

## 2020-05-16 NOTE — Telephone Encounter (Signed)
Pt mother call and stated she want a TB test and want a call back.

## 2020-05-17 NOTE — Telephone Encounter (Signed)
Great. Fine to schedule nurse visit for this.

## 2020-05-17 NOTE — Telephone Encounter (Signed)
Left a message at the pts cell number to return my call. 

## 2020-05-18 ENCOUNTER — Ambulatory Visit (INDEPENDENT_AMBULATORY_CARE_PROVIDER_SITE_OTHER): Payer: 59 | Admitting: *Deleted

## 2020-05-18 ENCOUNTER — Other Ambulatory Visit: Payer: Self-pay

## 2020-05-18 DIAGNOSIS — Z23 Encounter for immunization: Secondary | ICD-10-CM | POA: Diagnosis not present

## 2020-05-18 DIAGNOSIS — J309 Allergic rhinitis, unspecified: Secondary | ICD-10-CM | POA: Diagnosis not present

## 2020-05-18 MED ORDER — VITAMIN D (ERGOCALCIFEROL) 1.25 MG (50000 UNIT) PO CAPS: 50000.0000 [IU] | ORAL_CAPSULE | ORAL | 0 refills | Status: DC

## 2020-05-18 NOTE — Telephone Encounter (Signed)
TB skin test placed today.

## 2020-05-18 NOTE — Addendum Note (Signed)
Addended by: Johnella Moloney on: 05/18/2020 02:37 PM   Modules accepted: Orders

## 2020-05-18 NOTE — Addendum Note (Signed)
Addended by: Johnella Moloney on: 05/18/2020 02:30 PM   Modules accepted: Orders

## 2020-05-19 ENCOUNTER — Telehealth: Payer: Self-pay | Admitting: Family Medicine

## 2020-05-19 NOTE — Telephone Encounter (Signed)
[facility] is calling needing labs and office notes faxed over for the referral that they received.

## 2020-05-20 LAB — TB SKIN TEST
Induration: 0 mm
TB Skin Test: NEGATIVE

## 2020-06-20 DIAGNOSIS — J3089 Other allergic rhinitis: Secondary | ICD-10-CM

## 2020-06-20 NOTE — Progress Notes (Signed)
VIALS EXP 06-20-21 

## 2020-06-23 ENCOUNTER — Ambulatory Visit (INDEPENDENT_AMBULATORY_CARE_PROVIDER_SITE_OTHER): Payer: 59 | Admitting: *Deleted

## 2020-06-23 ENCOUNTER — Telehealth: Payer: Self-pay | Admitting: Allergy and Immunology

## 2020-06-23 DIAGNOSIS — J309 Allergic rhinitis, unspecified: Secondary | ICD-10-CM

## 2020-06-23 NOTE — Telephone Encounter (Signed)
Called pt to schedule yearly OV for insurance purposes regarding allergy injections, no answer. Left voicemail.

## 2020-07-26 ENCOUNTER — Ambulatory Visit (INDEPENDENT_AMBULATORY_CARE_PROVIDER_SITE_OTHER): Payer: 59 | Admitting: *Deleted

## 2020-07-26 DIAGNOSIS — J309 Allergic rhinitis, unspecified: Secondary | ICD-10-CM

## 2020-08-26 ENCOUNTER — Ambulatory Visit (INDEPENDENT_AMBULATORY_CARE_PROVIDER_SITE_OTHER): Payer: 59

## 2020-08-26 DIAGNOSIS — J309 Allergic rhinitis, unspecified: Secondary | ICD-10-CM | POA: Diagnosis not present

## 2020-08-30 ENCOUNTER — Ambulatory Visit (INDEPENDENT_AMBULATORY_CARE_PROVIDER_SITE_OTHER): Payer: 59 | Admitting: *Deleted

## 2020-08-30 DIAGNOSIS — J309 Allergic rhinitis, unspecified: Secondary | ICD-10-CM

## 2020-09-06 ENCOUNTER — Ambulatory Visit (INDEPENDENT_AMBULATORY_CARE_PROVIDER_SITE_OTHER): Payer: 59 | Admitting: *Deleted

## 2020-09-06 DIAGNOSIS — J309 Allergic rhinitis, unspecified: Secondary | ICD-10-CM | POA: Diagnosis not present

## 2020-09-13 ENCOUNTER — Ambulatory Visit (INDEPENDENT_AMBULATORY_CARE_PROVIDER_SITE_OTHER): Payer: 59

## 2020-09-13 DIAGNOSIS — J309 Allergic rhinitis, unspecified: Secondary | ICD-10-CM | POA: Diagnosis not present

## 2020-09-20 ENCOUNTER — Ambulatory Visit (INDEPENDENT_AMBULATORY_CARE_PROVIDER_SITE_OTHER): Payer: 59 | Admitting: *Deleted

## 2020-09-20 DIAGNOSIS — J309 Allergic rhinitis, unspecified: Secondary | ICD-10-CM | POA: Diagnosis not present

## 2020-10-18 ENCOUNTER — Ambulatory Visit (INDEPENDENT_AMBULATORY_CARE_PROVIDER_SITE_OTHER): Payer: 59 | Admitting: *Deleted

## 2020-10-18 DIAGNOSIS — J309 Allergic rhinitis, unspecified: Secondary | ICD-10-CM | POA: Diagnosis not present

## 2020-11-22 ENCOUNTER — Ambulatory Visit (INDEPENDENT_AMBULATORY_CARE_PROVIDER_SITE_OTHER): Payer: 59

## 2020-11-22 DIAGNOSIS — J309 Allergic rhinitis, unspecified: Secondary | ICD-10-CM | POA: Diagnosis not present

## 2020-12-20 ENCOUNTER — Ambulatory Visit (INDEPENDENT_AMBULATORY_CARE_PROVIDER_SITE_OTHER): Payer: 59 | Admitting: *Deleted

## 2020-12-20 DIAGNOSIS — J309 Allergic rhinitis, unspecified: Secondary | ICD-10-CM

## 2021-01-19 ENCOUNTER — Ambulatory Visit (INDEPENDENT_AMBULATORY_CARE_PROVIDER_SITE_OTHER): Payer: 59 | Admitting: *Deleted

## 2021-01-19 DIAGNOSIS — J309 Allergic rhinitis, unspecified: Secondary | ICD-10-CM

## 2021-01-24 DIAGNOSIS — J3089 Other allergic rhinitis: Secondary | ICD-10-CM

## 2021-01-24 NOTE — Progress Notes (Signed)
VIALS MADE. EXP 01-24-22 

## 2021-02-14 ENCOUNTER — Ambulatory Visit (INDEPENDENT_AMBULATORY_CARE_PROVIDER_SITE_OTHER): Payer: 59

## 2021-02-14 DIAGNOSIS — J309 Allergic rhinitis, unspecified: Secondary | ICD-10-CM | POA: Diagnosis not present

## 2021-02-27 ENCOUNTER — Ambulatory Visit (INDEPENDENT_AMBULATORY_CARE_PROVIDER_SITE_OTHER): Payer: 59 | Admitting: Family Medicine

## 2021-02-27 ENCOUNTER — Other Ambulatory Visit: Payer: Self-pay

## 2021-02-27 VITALS — BP 118/64 | HR 104 | Temp 98.7°F | Wt 80.0 lb

## 2021-02-27 DIAGNOSIS — R42 Dizziness and giddiness: Secondary | ICD-10-CM | POA: Diagnosis not present

## 2021-02-27 NOTE — Progress Notes (Signed)
Established Patient Office Visit  Subjective:  Patient ID: Marissa Diaz, female    DOB: 1996/01/28  Age: 25 y.o. MRN: [id number]  CC:  Chief Complaint  Patient presents with   Dizziness    X 2 days, comes and goes. No other symptoms    HPI Marissa Diaz presents for onset 4 days ago of transient vertigo.  This occurred near the middle of the day.  She was at work and had to leave early.  She noticed when stooping over she had some vertigo symptoms.  No syncopal or presyncopal symptoms.  No nausea or vomiting.  No headache.  No confusion.  No hearing loss.  No visual changes.  No ataxia.  No prior history of vertigo.  First episode occurred this past Thursday.  She had transient episodes again on Friday and Saturday but none since then.  No speech changes.  She is not sure if there was a right versus left directional component.  Past Medical History:  Diagnosis Date   Allergic rhinitis    sees allergist, Dr. Nancee Liter loss    sees dermatologist at [facility], Dr. Deborah Chalk stature    reports extensive evaluation with geneticist and endocrine and told no abnormalities    Past Surgical History:  Procedure Laterality Date   EYE SURGERY  2007   left eye due to infection   OTHER SURGICAL HISTORY     Surgery due to left arm compound fracture   TONSILLECTOMY  2008    Family History  Problem Relation Age of Onset   Allergic rhinitis Mother    Healthy Father    Learning disabilities Brother    Asthma Brother    Arthritis Maternal Grandmother    Hyperlipidemia Maternal Grandmother    Thyroid disease Maternal Grandmother    Colon polyps Maternal Grandmother    Early death Maternal Grandfather        renal failure    Social History   Socioeconomic History   Marital status: Single    Spouse name: Not on file   Number of children: Not on file   Years of education: Not on file   Highest education level: Not on file  Occupational History   Not on file   Tobacco Use   Smoking status: Never   Smokeless tobacco: Never  Substance and Sexual Activity   Alcohol use: No   Drug use: No   Sexual activity: Never  Other Topics Concern   Not on file  Social History Narrative   Not on file   Social Determinants of Health   Financial Resource Strain: Not on file  Food Insecurity: Not on file  Transportation Needs: Not on file  Physical Activity: Not on file  Stress: Not on file  Social Connections: Not on file  Intimate Partner Violence: Not on file    Outpatient Medications Prior to Visit  Medication Sig Dispense Refill   BLACK CURRANT SEED OIL PO Take by mouth daily.     EPINEPHrine 0.3 mg/0.3 mL IJ SOAJ injection Inject into the muscle as needed.     Multiple Vitamins-Minerals (MULTIVITAMIN ADULT PO) Take by mouth.     NON FORMULARY Allergy injections-treated by Dr Lucie Leather     OVER THE COUNTER MEDICATION Ultimate Omega-2 soft gels daily     OVER THE COUNTER MEDICATION Fiber supplement     Vitamin D, Ergocalciferol, (DRISDOL) 1.25 MG (50000 UNIT) CAPS capsule Take 1 capsule (50,000 Units total) by mouth  every 7 (seven) days. 12 capsule 0   No facility-administered medications prior to visit.    Allergies  Allergen Reactions   Grass Extracts [Gramineae Pollens] Other (See Comments)    sneezing   Tree Extract Other (See Comments)    sneezing    ROS Review of Systems  Constitutional:  Negative for chills and fever.  HENT:  Negative for trouble swallowing.   Eyes:  Negative for visual disturbance.  Cardiovascular:  Negative for chest pain.  Gastrointestinal:  Negative for nausea and vomiting.  Neurological:  Positive for dizziness. Negative for tremors, seizures, syncope, speech difficulty, weakness and headaches.       See HPI  Psychiatric/Behavioral:  Negative for confusion.      Objective:    Physical Exam Constitutional:      Appearance: She is well-developed.  Eyes:     Extraocular Movements: Extraocular movements  intact.     Pupils: Pupils are equal, round, and reactive to light.  Neck:     Thyroid: No thyromegaly.     Vascular: No JVD.  Cardiovascular:     Rate and Rhythm: Normal rate and regular rhythm.     Heart sounds:    No gallop.  Pulmonary:     Effort: Pulmonary effort is normal. No respiratory distress.     Breath sounds: Normal breath sounds. No wheezing or rales.  Musculoskeletal:     Cervical back: Neck supple.  Neurological:     General: No focal deficit present.     Mental Status: She is alert and oriented to person, place, and time. Mental status is at baseline.     Cranial Nerves: No cranial nerve deficit.     Motor: No weakness.     Gait: Gait normal.     Comments: No reproducible vertigo on exam    BP 118/64 (BP Location: Left Arm, Patient Position: Sitting, Cuff Size: Normal)   Pulse (!) 104   Temp 98.7 F (37.1 C) (Oral)   Wt 80 lb (36.3 kg)   SpO2 99%   BMI 17.94 kg/m  Wt Readings from Last 3 Encounters:  02/27/21 80 lb (36.3 kg)  05/11/20 79 lb (35.8 kg)  11/26/17 80 lb 14.4 oz (36.7 kg)     Health Maintenance Due  Topic Date Due   PAP-Cervical Cytology Screening  Never done   PAP SMEAR-Modifier  Never done   COVID-19 Vaccine (3 - Booster for Pfizer series) 09/26/2019   INFLUENZA VACCINE  Never done    There are no preventive care reminders to display for this patient.  Lab Results  Component Value Date   TSH 1.12 05/11/2020   Lab Results  Component Value Date   WBC 5.8 05/11/2020   HGB 11.5 (L) 05/11/2020   HCT 36.0 05/11/2020   MCV 75.6 (L) 05/11/2020   PLT 334.0 05/11/2020   Lab Results  Component Value Date   NA 136 05/11/2020   K 4.0 05/11/2020   CO2 23 05/11/2020   GLUCOSE 77 05/11/2020   BUN 15 05/11/2020   CREATININE 0.72 05/11/2020   BILITOT 0.3 05/11/2020   ALKPHOS 77 05/11/2020   AST 19 05/11/2020   ALT 10 05/11/2020   PROT 8.2 05/11/2020   ALBUMIN 4.8 05/11/2020   CALCIUM 9.9 05/11/2020   GFR 118.64 05/11/2020   Lab  Results  Component Value Date   CHOL 140 05/11/2020   Lab Results  Component Value Date   HDL 60.40 05/11/2020   Lab Results  Component Value Date  LDLCALC 62 05/11/2020   Lab Results  Component Value Date   TRIG 88.0 05/11/2020   Lab Results  Component Value Date   CHOLHDL 2 05/11/2020   No results found for: HGBA1C    Assessment & Plan:   Transient vertigo symptoms.  Nonfocal neuro exam at this time.  She did not have any red flags such as hearing loss, visual changes, headache, ataxia, etc.  -Suspect benign vertigo.  Recommend observation for now.  Reviewed signs and symptoms of more worrisome vertigo. -If she has recurrence we have asked that she try to pay attention whether there is a directional component  No orders of the defined types were placed in this encounter.   Follow-up: No follow-ups on file.    Evelena Peat, MD

## 2021-03-14 ENCOUNTER — Ambulatory Visit (INDEPENDENT_AMBULATORY_CARE_PROVIDER_SITE_OTHER): Payer: 59 | Admitting: *Deleted

## 2021-03-14 DIAGNOSIS — J309 Allergic rhinitis, unspecified: Secondary | ICD-10-CM

## 2021-04-18 ENCOUNTER — Ambulatory Visit (INDEPENDENT_AMBULATORY_CARE_PROVIDER_SITE_OTHER): Payer: 59 | Admitting: *Deleted

## 2021-04-18 DIAGNOSIS — J309 Allergic rhinitis, unspecified: Secondary | ICD-10-CM | POA: Diagnosis not present

## 2021-04-27 ENCOUNTER — Ambulatory Visit (INDEPENDENT_AMBULATORY_CARE_PROVIDER_SITE_OTHER): Payer: 59

## 2021-04-27 DIAGNOSIS — J309 Allergic rhinitis, unspecified: Secondary | ICD-10-CM | POA: Diagnosis not present

## 2021-05-02 ENCOUNTER — Ambulatory Visit (INDEPENDENT_AMBULATORY_CARE_PROVIDER_SITE_OTHER): Payer: 59 | Admitting: *Deleted

## 2021-05-02 DIAGNOSIS — J309 Allergic rhinitis, unspecified: Secondary | ICD-10-CM | POA: Diagnosis not present

## 2021-05-09 ENCOUNTER — Ambulatory Visit (INDEPENDENT_AMBULATORY_CARE_PROVIDER_SITE_OTHER): Payer: Self-pay | Admitting: *Deleted

## 2021-05-09 DIAGNOSIS — J309 Allergic rhinitis, unspecified: Secondary | ICD-10-CM

## 2021-05-18 ENCOUNTER — Ambulatory Visit (INDEPENDENT_AMBULATORY_CARE_PROVIDER_SITE_OTHER): Payer: Self-pay

## 2021-05-18 DIAGNOSIS — J309 Allergic rhinitis, unspecified: Secondary | ICD-10-CM

## 2021-06-15 ENCOUNTER — Ambulatory Visit (INDEPENDENT_AMBULATORY_CARE_PROVIDER_SITE_OTHER): Payer: 59

## 2021-06-15 DIAGNOSIS — J309 Allergic rhinitis, unspecified: Secondary | ICD-10-CM

## 2021-07-13 ENCOUNTER — Ambulatory Visit (INDEPENDENT_AMBULATORY_CARE_PROVIDER_SITE_OTHER): Payer: 59

## 2021-07-13 DIAGNOSIS — J309 Allergic rhinitis, unspecified: Secondary | ICD-10-CM

## 2021-08-14 ENCOUNTER — Ambulatory Visit (INDEPENDENT_AMBULATORY_CARE_PROVIDER_SITE_OTHER): Payer: 59

## 2021-08-14 DIAGNOSIS — J309 Allergic rhinitis, unspecified: Secondary | ICD-10-CM

## 2021-08-28 DIAGNOSIS — R768 Other specified abnormal immunological findings in serum: Secondary | ICD-10-CM | POA: Diagnosis not present

## 2021-08-28 DIAGNOSIS — E559 Vitamin D deficiency, unspecified: Secondary | ICD-10-CM | POA: Diagnosis not present

## 2021-08-28 DIAGNOSIS — L659 Nonscarring hair loss, unspecified: Secondary | ICD-10-CM | POA: Diagnosis not present

## 2021-09-04 DIAGNOSIS — J3089 Other allergic rhinitis: Secondary | ICD-10-CM | POA: Diagnosis not present

## 2021-09-04 NOTE — Progress Notes (Signed)
VIALS EXP 09-05-22 ?

## 2021-09-14 ENCOUNTER — Ambulatory Visit (INDEPENDENT_AMBULATORY_CARE_PROVIDER_SITE_OTHER): Payer: 59

## 2021-09-14 DIAGNOSIS — J309 Allergic rhinitis, unspecified: Secondary | ICD-10-CM

## 2021-10-12 ENCOUNTER — Ambulatory Visit (INDEPENDENT_AMBULATORY_CARE_PROVIDER_SITE_OTHER): Payer: 59

## 2021-10-12 DIAGNOSIS — J309 Allergic rhinitis, unspecified: Secondary | ICD-10-CM

## 2021-11-09 ENCOUNTER — Ambulatory Visit (INDEPENDENT_AMBULATORY_CARE_PROVIDER_SITE_OTHER): Payer: 59

## 2021-11-09 DIAGNOSIS — J309 Allergic rhinitis, unspecified: Secondary | ICD-10-CM | POA: Diagnosis not present

## 2021-12-01 NOTE — Patient Instructions (Incomplete)
1. Other allergic rhinitis      Continue immunotherapy for now. Schedule an appointment for skin testing to environmental allergens. She will need to be off all antihistamines 3 days prior to this appointment. If skin test to environmental allergies are negative would recommend stopping allergy injections   2.  Schedule an appointment at your convenience for skin testing to environmental allergens

## 2021-12-04 ENCOUNTER — Ambulatory Visit (INDEPENDENT_AMBULATORY_CARE_PROVIDER_SITE_OTHER): Payer: 59 | Admitting: Family

## 2021-12-04 ENCOUNTER — Encounter: Payer: Self-pay | Admitting: Family

## 2021-12-04 VITALS — BP 108/70 | HR 98 | Temp 98.3°F | Resp 20 | Ht <= 58 in | Wt 83.2 lb

## 2021-12-04 DIAGNOSIS — J3089 Other allergic rhinitis: Secondary | ICD-10-CM

## 2021-12-04 DIAGNOSIS — J302 Other seasonal allergic rhinitis: Secondary | ICD-10-CM

## 2021-12-04 DIAGNOSIS — E559 Vitamin D deficiency, unspecified: Secondary | ICD-10-CM | POA: Insufficient documentation

## 2021-12-04 MED ORDER — EPINEPHRINE 0.3 MG/0.3ML IJ SOAJ: 0.3000 mg | INTRAMUSCULAR | 1 refills | Status: AC | PRN

## 2021-12-04 NOTE — Progress Notes (Signed)
[address] [place] [address] Dept: [phone]  FOLLOW UP NOTE  Patient ID: Marissa Diaz, female    DOB: 04-27-1996  Age: 26 y.o. MRN: [id number] Date of Office Visit: 12/04/2021  Assessment  Chief Complaint: Allergic Rhinitis   HPI Marissa Diaz is a 26 year old female who presents today for follow-up of allergic rhinitis.  She was last seen on July 23, 2017 by Dr. Lucie Leather.  Her mom is here with her today and helps provide history.  Seasonal and perennial allergic rhinitis is reported as controlled with allergy injections every 4 weeks.  She is no longer taking an antihistamine or using a nasal spray.  She denies rhinorrhea, nasal congestion, and postnasal drip.  Mom reports that she has probably not had allergy symptoms for the past 5 years.  She has not had any sinus infections since we last saw her.  She denies any problems or reactions with her allergy injections. She has been getting allergy injections since she was 65 or 26 years old.  She is possibly interested in stopping allergy injections, but has concerns about symptoms possibly returning.  Her mom reports that she has gradually been having hair loss and is seeing a dermatologist.  She also has eczema for which she does not need any prescription medications.  She  will use Aquaphor or almond oil daily for moisturizing.  She reports that her eczema usually flare on her lower legs and sometimes in her arms.   Drug Allergies:  Allergies  Allergen Reactions   Grass Extracts [Gramineae Pollens] Other (See Comments)    sneezing   Tree Extract Other (See Comments)    sneezing    Review of Systems: Review of Systems  Constitutional:  Negative for chills and fever.  HENT:         Denies rhinorrhea, nasal congestion, and postnasal drip.  Eyes:        Denies itchy watery eyes  Respiratory:  Negative for cough, shortness of breath and wheezing.   Cardiovascular:  Negative for chest pain and  palpitations.  Gastrointestinal:        Denies heartburn or reflux symptoms  Genitourinary:  Negative for frequency.  Skin:  Positive for itching.       She reports itching at times due to her eczema.  Mom also reports that he is gradually been having hair loss and is seeing dermatology.  Neurological:  Negative for headaches.  Endo/Heme/Allergies:  Positive for environmental allergies.     Physical Exam: BP 108/70   Pulse 98   Temp 98.3 F (36.8 C) (Temporal)   Resp 20   Ht 4' 8.5" (1.435 m)   Wt 83 lb 3.2 oz (37.7 kg)   SpO2 100%   BMI 18.32 kg/m    Physical Exam Exam conducted with a chaperone present.  Constitutional:      Appearance: Normal appearance.  HENT:     Head: Normocephalic and atraumatic.     Comments: Pharynx normal, eyes normal, ears normal, nose: Bilateral lower turbinates mildly edematous with no drainage noted    Right Ear: Tympanic membrane, ear canal and external ear normal.     Left Ear: Tympanic membrane, ear canal and external ear normal.     Mouth/Throat:     Mouth: Mucous membranes are moist.     Pharynx: Oropharynx is clear.  Eyes:     Conjunctiva/sclera: Conjunctivae normal.  Cardiovascular:     Rate and Rhythm: Regular rhythm. Tachycardia present.  Heart sounds: Normal heart sounds.  Pulmonary:     Effort: Pulmonary effort is normal.     Breath sounds: Normal breath sounds.     Comments: Lungs clear to auscultation Musculoskeletal:     Cervical back: Neck supple.  Skin:    General: Skin is warm.     Comments: No eczematous lesions noted on exposed skin  Neurological:     Mental Status: She is alert and oriented to person, place, and time.  Psychiatric:        Mood and Affect: Mood normal.        Behavior: Behavior normal.        Thought Content: Thought content normal.        Judgment: Judgment normal.     Diagnostics:  none  Assessment and Plan: 1. Seasonal and perennial allergic rhinitis     Meds ordered this  encounter  Medications   EPINEPHrine 0.3 mg/0.3 mL IJ SOAJ injection    Sig: Inject 0.3 mg into the muscle as needed.    Dispense:  1 each    Refill:  1    Patient Instructions   1. Other allergic rhinitis      Continue immunotherapy for now. Schedule an appointment for skin testing to environmental allergens. She will need to be off all antihistamines 3 days prior to this appointment. If skin test to environmental allergies are negative would recommend stopping allergy injections   2.  Schedule an appointment at your convenience for skin testing to environmental allergens   Return in about 1 year (around 12/05/2022), or if symptoms worsen or fail to improve, for skin testing.    Thank you for the opportunity to care for this patient.  Please do not hesitate to contact me with questions.  Nehemiah Settle, FNP [facility]

## 2021-12-07 ENCOUNTER — Ambulatory Visit (INDEPENDENT_AMBULATORY_CARE_PROVIDER_SITE_OTHER): Payer: 59

## 2021-12-07 DIAGNOSIS — J309 Allergic rhinitis, unspecified: Secondary | ICD-10-CM

## 2021-12-12 ENCOUNTER — Ambulatory Visit (INDEPENDENT_AMBULATORY_CARE_PROVIDER_SITE_OTHER): Payer: 59 | Admitting: *Deleted

## 2021-12-12 DIAGNOSIS — J309 Allergic rhinitis, unspecified: Secondary | ICD-10-CM

## 2021-12-19 ENCOUNTER — Ambulatory Visit (INDEPENDENT_AMBULATORY_CARE_PROVIDER_SITE_OTHER): Payer: 59

## 2021-12-19 DIAGNOSIS — J309 Allergic rhinitis, unspecified: Secondary | ICD-10-CM | POA: Diagnosis not present

## 2021-12-26 ENCOUNTER — Ambulatory Visit (INDEPENDENT_AMBULATORY_CARE_PROVIDER_SITE_OTHER): Payer: 59 | Admitting: *Deleted

## 2021-12-26 DIAGNOSIS — J309 Allergic rhinitis, unspecified: Secondary | ICD-10-CM | POA: Diagnosis not present

## 2022-01-04 ENCOUNTER — Ambulatory Visit (INDEPENDENT_AMBULATORY_CARE_PROVIDER_SITE_OTHER): Payer: 59

## 2022-01-04 DIAGNOSIS — J309 Allergic rhinitis, unspecified: Secondary | ICD-10-CM | POA: Diagnosis not present

## 2022-01-24 ENCOUNTER — Encounter: Payer: Self-pay | Admitting: Family Medicine

## 2022-01-24 ENCOUNTER — Ambulatory Visit (INDEPENDENT_AMBULATORY_CARE_PROVIDER_SITE_OTHER): Payer: 59 | Admitting: Family Medicine

## 2022-01-24 VITALS — BP 100/50 | HR 74 | Temp 98.2°F | Ht <= 58 in | Wt 83.6 lb

## 2022-01-24 DIAGNOSIS — D508 Other iron deficiency anemias: Secondary | ICD-10-CM

## 2022-01-24 DIAGNOSIS — Z23 Encounter for immunization: Secondary | ICD-10-CM | POA: Diagnosis not present

## 2022-01-24 DIAGNOSIS — Z30011 Encounter for initial prescription of contraceptive pills: Secondary | ICD-10-CM

## 2022-01-24 DIAGNOSIS — D509 Iron deficiency anemia, unspecified: Secondary | ICD-10-CM | POA: Insufficient documentation

## 2022-01-24 DIAGNOSIS — Z309 Encounter for contraceptive management, unspecified: Secondary | ICD-10-CM | POA: Insufficient documentation

## 2022-01-24 DIAGNOSIS — E611 Iron deficiency: Secondary | ICD-10-CM

## 2022-01-24 DIAGNOSIS — E559 Vitamin D deficiency, unspecified: Secondary | ICD-10-CM | POA: Diagnosis not present

## 2022-01-24 DIAGNOSIS — L659 Nonscarring hair loss, unspecified: Secondary | ICD-10-CM | POA: Diagnosis not present

## 2022-01-24 LAB — CBC WITH DIFFERENTIAL/PLATELET
Basophils Absolute: 0 10*3/uL (ref 0.0–0.1)
Basophils Relative: 0.5 % (ref 0.0–3.0)
Eosinophils Absolute: 0 10*3/uL (ref 0.0–0.7)
Eosinophils Relative: 0.3 % (ref 0.0–5.0)
HCT: 36.3 % (ref 36.0–46.0)
Hemoglobin: 11.6 g/dL — ABNORMAL LOW (ref 12.0–15.0)
Lymphocytes Relative: 27 % (ref 12.0–46.0)
Lymphs Abs: 1.5 10*3/uL (ref 0.7–4.0)
MCHC: 32 g/dL (ref 30.0–36.0)
MCV: 77.8 fl — ABNORMAL LOW (ref 78.0–100.0)
Monocytes Absolute: 0.4 10*3/uL (ref 0.1–1.0)
Monocytes Relative: 8.2 % (ref 3.0–12.0)
Neutro Abs: 3.5 10*3/uL (ref 1.4–7.7)
Neutrophils Relative %: 64 % (ref 43.0–77.0)
Platelets: 353 10*3/uL (ref 150.0–400.0)
RBC: 4.66 Mil/uL (ref 3.87–5.11)
RDW: 17.1 % — ABNORMAL HIGH (ref 11.5–15.5)
WBC: 5.4 10*3/uL (ref 4.0–10.5)

## 2022-01-24 LAB — TSH: TSH: 0.86 u[IU]/mL (ref 0.35–5.50)

## 2022-01-24 LAB — VITAMIN D 25 HYDROXY (VIT D DEFICIENCY, FRACTURES): VITD: 11.53 ng/mL — ABNORMAL LOW (ref 30.00–100.00)

## 2022-01-24 MED ORDER — NORETHIN ACE-ETH ESTRAD-FE 1-20 MG-MCG PO TABS: 1.0000 | ORAL_TABLET | Freq: Every day | ORAL | 11 refills | Status: DC

## 2022-01-24 NOTE — Assessment & Plan Note (Addendum)
We discussed options for management of her heavy periods, especially in the setting of iron deficiency anemia. Will start Loestrin Fe daily. She needs a new CBC and iron panel today.

## 2022-01-24 NOTE — Progress Notes (Signed)
Established Patient Office Visit  Subjective   Patient ID: Marissa Diaz, female    DOB: 20-May-1995  Age: 26 y.o. MRN: [id number]  No chief complaint on file.   Patient is here for transition of care visit.   Patient reports that she is doing well, no new symptoms or issues to discuss today. She reports a history of chronic allergies, has an allergist who gives her injections, states she will be stopping them soon.   Patient has a history of iron deficiency and vitamin D deficiency, needs new labs today. Pt reports she has not been taking supplements. She reports her periods are regular, once a month, however she feels that they are very heavy, especially the first 2 days of her period, going through multiple pads/tampons per day, states she gets a lot of cramps and nausea. States they last about 4-5 days. Patient is not/ has never been sexually active.   Iron deficiency anemia-- pt not taking iron supplements, last labs from April showed Hb at 10.8, needs new iron panel and CBC today to evaluate.  Vitamin D deficiency - not currently taking vitamin D, needs new level checked today.  Alopecia-- mom present in visit today, she states that patient has seen dermatology in the past, that her hair has thinned significantly to the point that she only has 1 patch growing on the back of her head. Is having to wear wigs daily.     Patient Active Problem List   Diagnosis Date Noted   Contraception management 01/24/2022   Alopecia 01/24/2022   Iron deficiency anemia 01/24/2022   Vitamin D deficiency 12/04/2021   Allergic rhinoconjunctivitis 01/26/2015      Review of Systems  Constitutional:  Negative for malaise/fatigue and weight loss.  Musculoskeletal:  Negative for joint pain.  Neurological:  Negative for dizziness and headaches.  All other systems reviewed and are negative.     Objective:     BP (!) 100/50 (BP Location: Left Arm, Patient Position: Sitting, Cuff Size:  Normal)   Pulse 74   Temp 98.2 F (36.8 C) (Oral)   Ht 4' 8.5" (1.435 m)   Wt 83 lb 9.6 oz (37.9 kg)   LMP 01/10/2022 (Approximate)   SpO2 97%   BMI 18.41 kg/m    Physical Exam Vitals reviewed.  Constitutional:      Appearance: Normal appearance. She is well-groomed and normal weight.  HENT:     Head: Normocephalic and atraumatic.  Eyes:     Conjunctiva/sclera: Conjunctivae normal.  Cardiovascular:     Rate and Rhythm: Normal rate and regular rhythm.     Heart sounds: S1 normal and S2 normal.  Pulmonary:     Effort: Pulmonary effort is normal.     Breath sounds: Normal breath sounds and air entry.  Abdominal:     General: Bowel sounds are normal.  Musculoskeletal:        General: Normal range of motion.     Cervical back: Normal range of motion and neck supple.     Right lower leg: No edema.     Left lower leg: No edema.  Skin:    General: Skin is warm and dry.  Neurological:     Mental Status: She is alert and oriented to person, place, and time. Mental status is at baseline.     Gait: Gait is intact.  Psychiatric:        Mood and Affect: Mood and affect normal.  Speech: Speech normal.        Behavior: Behavior normal.        Judgment: Judgment normal.       The ASCVD Risk score (Arnett DK, et al., 2019) failed to calculate for the following reasons:   The 2019 ASCVD risk score is only valid for ages 61 to 25    Assessment & Plan:   Problem List Items Addressed This Visit       Musculoskeletal and Integument   Alopecia (Chronic)    Has seen dermatology in the past, no known explanation given, she did have autoimmune labs drawn which showed an elevated ESR and positive ANA. Has been following with Dr. Kathlene November yearly for this. Will check TSH to rule out thyroid disease.        Other   Vitamin D deficiency    Needs new level today for evaluation.      Relevant Orders   Vitamin D, 25-hydroxy (Completed)   Contraception management    We discussed  options for management of her heavy periods, especially in the setting of iron deficiency anemia. Will start Loestrin Fe daily. She needs a new CBC and iron panel today.      Relevant Medications   norethindrone-ethinyl estradiol-FE (LOESTRIN FE 1/20) 1-20 MG-MCG tablet   Iron deficiency anemia (Chronic)    Needs new CBC and iron panel today, this was ordered.      Other Visit Diagnoses     Need for immunization against influenza    -  Primary   Relevant Orders   Flu Vaccine QUAD 6+ mos PF IM (Fluarix Quad PF) (Completed)   Iron deficiency       Relevant Orders   CBC with Differential/Platelets (Completed)   Iron, TIBC and Ferritin Panel   Hair loss       Relevant Orders   TSH (Completed)       Return in about 1 year (around 01/25/2023) for Annual physical exam.    Farrel Conners, MD

## 2022-01-24 NOTE — Patient Instructions (Signed)
Start birth control on the Sunday after you start your period

## 2022-01-24 NOTE — Assessment & Plan Note (Signed)
Needs new CBC and iron panel today, this was ordered.

## 2022-01-24 NOTE — Assessment & Plan Note (Signed)
Has seen dermatology in the past, no known explanation given, she did have autoimmune labs drawn which showed an elevated ESR and positive ANA. Has been following with Dr. Kathlene November yearly for this. Will check TSH to rule out thyroid disease.

## 2022-01-24 NOTE — Assessment & Plan Note (Signed)
Needs new level today for evaluation.

## 2022-01-25 LAB — IRON,TIBC AND FERRITIN PANEL
%SAT: 11 % (calc) — ABNORMAL LOW (ref 16–45)
Ferritin: 3 ng/mL — ABNORMAL LOW (ref 16–154)
Iron: 66 ug/dL (ref 40–190)
TIBC: 580 mcg/dL (calc) — ABNORMAL HIGH (ref 250–450)

## 2022-01-25 MED ORDER — VITAMIN D (ERGOCALCIFEROL) 1.25 MG (50000 UNIT) PO CAPS: 50000.0000 [IU] | ORAL_CAPSULE | ORAL | 3 refills | Status: DC

## 2022-01-25 NOTE — Addendum Note (Signed)
Addended by: Farrel Conners on: 01/25/2022 08:10 AM   Modules accepted: Orders

## 2022-01-30 ENCOUNTER — Ambulatory Visit (INDEPENDENT_AMBULATORY_CARE_PROVIDER_SITE_OTHER): Payer: 59 | Admitting: *Deleted

## 2022-01-30 DIAGNOSIS — J309 Allergic rhinitis, unspecified: Secondary | ICD-10-CM

## 2022-02-06 ENCOUNTER — Ambulatory Visit: Payer: 59 | Admitting: Allergy & Immunology

## 2022-02-27 ENCOUNTER — Ambulatory Visit (INDEPENDENT_AMBULATORY_CARE_PROVIDER_SITE_OTHER): Payer: 59 | Admitting: *Deleted

## 2022-02-27 DIAGNOSIS — J309 Allergic rhinitis, unspecified: Secondary | ICD-10-CM | POA: Diagnosis not present

## 2022-03-09 ENCOUNTER — Encounter: Payer: Self-pay | Admitting: Allergy

## 2022-03-09 ENCOUNTER — Ambulatory Visit: Payer: 59 | Admitting: Allergy

## 2022-03-09 VITALS — BP 92/60 | HR 92 | Temp 98.0°F | Resp 16 | Wt 84.0 lb

## 2022-03-09 DIAGNOSIS — J3089 Other allergic rhinitis: Secondary | ICD-10-CM

## 2022-03-09 NOTE — Progress Notes (Signed)
Follow-up Note  RE: Marissa Diaz MRN: [id number] DOB: Jun 08, 1995 Date of Office Visit: 03/09/2022   History of present illness: Marissa Diaz is a 26 y.o. female presenting today for skin testing.  She was last seen in the office on 12/04/21 by our nurse practitioner Marissa Diaz.    She has been on allergen immunotherapy and is wanting to retest to see if she can discontinue.  She states she has seen a big difference in her allergy symptoms since she has been on immunotherapy and at this time does not report having any symptoms.  She states she will use allergy medications as needed but has not been needing anything.  She has not had any antihistamines for past 3 days at least for testing today.   Review of systems: Review of Systems  Constitutional: Negative.   HENT: Negative.    Eyes: Negative.   Respiratory: Negative.    Cardiovascular: Negative.   Gastrointestinal: Negative.   Musculoskeletal: Negative.   Skin: Negative.   Allergic/Immunologic: Negative.   Neurological: Negative.      All other systems negative unless noted above in HPI  Past medical/social/surgical/family history have been reviewed and are unchanged unless specifically indicated below.  No changes  Medication List: Current Outpatient Medications  Medication Sig Dispense Refill   BLACK CURRANT SEED OIL PO Take by mouth daily.     EPINEPHrine 0.3 mg/0.3 mL IJ SOAJ injection See admin instructions.     EPINEPHrine 0.3 mg/0.3 mL IJ SOAJ injection Inject 0.3 mg into the muscle as needed. 1 each 1   Multiple Vitamin (MULTI VITAMIN) TABS 1 tablet     NON FORMULARY Allergy injections-treated by Dr Marissa Diaz     norethindrone-ethinyl estradiol-FE (LOESTRIN FE 1/20) 1-20 MG-MCG tablet Take 1 tablet by mouth daily. 28 tablet 11   OVER THE COUNTER MEDICATION Ultimate Omega-2 soft gels daily     OVER THE COUNTER MEDICATION Fiber supplement     Vitamin D, Ergocalciferol, (DRISDOL) 1.25 MG (50000 UNIT) CAPS  capsule Take 1 capsule (50,000 Units total) by mouth every 7 (seven) days. 12 capsule 3   No current facility-administered medications for this visit.     Known medication allergies: Allergies  Allergen Reactions   Grass Extracts [Gramineae Pollens] Other (See Comments)    sneezing   Tree Extract Other (See Comments)    sneezing     Physical examination: Blood pressure 92/60, pulse 92, temperature 98 F (36.7 C), temperature source Temporal, resp. rate 16, weight 84 lb (38.1 kg), SpO2 100 %.  General: Alert, interactive, in no acute distress. HEENT: PERRLA, TMs pearly gray, turbinates non-edematous without discharge, post-pharynx non erythematous. Neck: Supple without lymphadenopathy. Lungs: Clear to auscultation without wheezing, rhonchi or rales. {no increased work of breathing. CV: Normal S1, S2 without murmurs. Abdomen: Nondistended, nontender. Skin: Warm and dry, without lesions or rashes. Extremities:  No clubbing, cyanosis or edema. Neuro:   Grossly intact.  Diagnositics/Labs:  Allergy testing:   Airborne Adult Perc - 03/09/22 0943     Time Antigen Placed 3875    Allergen Manufacturer Marissa Diaz    Location Back    Number of Test 59    1. Control-Buffer 50% Glycerol Negative    2. Control-Histamine 1 mg/ml 2+    3. Albumin saline Negative    4. Bahia Negative    5. French Southern Territories Negative    6. Johnson Negative    7. Kentucky Blue Negative    8. Meadow Fescue Negative  9. Perennial Rye Negative    10. Sweet Vernal Negative    11. Timothy Negative    12. Cocklebur Negative    13. Burweed Marshelder Negative    14. Ragweed, short Negative    15. Ragweed, Giant Negative    16. Plantain,  English Negative    17. Lamb's Quarters Negative    18. Sheep Sorrell Negative    19. Rough Pigweed Negative    20. Marsh Elder, Rough Negative    21. Mugwort, Common Negative    22. Ash mix Negative    23. Birch mix Negative    24. Beech American Negative    25. Box, Elder  Negative    26. Cedar, red Negative    27. Cottonwood, [place] Negative    28. Elm mix Negative    29. Hickory Negative    30. Maple mix Negative    31. Oak, [place] mix Negative    32. Pecan Pollen Negative    33. Pine mix Negative    34. Sycamore Eastern Negative    35. Gordon, Black Pollen Negative    36. Alternaria alternata Negative    37. Cladosporium Herbarum Negative    38. Aspergillus mix Negative    39. Penicillium mix Negative    40. Bipolaris sorokiniana (Helminthosporium) Negative    41. Drechslera spicifera (Curvularia) Negative    42. Mucor plumbeus Negative    43. Fusarium moniliforme Negative    44. Aureobasidium pullulans (pullulara) Negative    45. Rhizopus oryzae Negative    46. Botrytis cinera Negative    47. Epicoccum nigrum Negative    48. Phoma betae Negative    49. Candida Albicans Negative    50. Trichophyton mentagrophytes Negative    51. Mite, D Farinae  5,000 AU/ml Negative    52. Mite, D Pteronyssinus  5,000 AU/ml Negative    53. Cat Hair 10,000 BAU/ml Negative    54.  Dog Epithelia Negative    55. Mixed Feathers Negative    56. Horse Epithelia Negative    57. Cockroach, German Negative    58. Mouse Negative    59. Tobacco Leaf Negative             Allergy testing results were read and interpreted by provider, documented by clinical staff.   Assessment and plan:   Allergic Rhinitis    Environmental allergy testing today is NEGATIVE!!!!!    You have had a great response with allergen immunotherapy and you have lost all sensitivities.     You can finish out your current allergy vials and once completed you are done with shots!    Follow-up as needed   I appreciate the opportunity to take part in [facility] care. Please do not hesitate to contact me with questions.  Sincerely,   Marissa Feeler, MD Allergy/Immunology [facility] [place] [facility] [place]

## 2022-03-09 NOTE — Patient Instructions (Addendum)
Allergic Rhinitis    Environmental allergy testing today is NEGATIVE!!!!!    You have had a great response with allergen immunotherapy and you have lost all sensitivities.     You can finish out your current allergy vials and once completed you are done with shots!     Congratulations! Follow-up as needed

## 2022-03-27 ENCOUNTER — Ambulatory Visit (INDEPENDENT_AMBULATORY_CARE_PROVIDER_SITE_OTHER): Payer: 59 | Admitting: Student

## 2022-03-27 DIAGNOSIS — J309 Allergic rhinitis, unspecified: Secondary | ICD-10-CM | POA: Diagnosis not present

## 2022-04-24 ENCOUNTER — Ambulatory Visit (INDEPENDENT_AMBULATORY_CARE_PROVIDER_SITE_OTHER): Payer: 59

## 2022-04-24 DIAGNOSIS — J309 Allergic rhinitis, unspecified: Secondary | ICD-10-CM | POA: Diagnosis not present

## 2022-05-25 ENCOUNTER — Ambulatory Visit (INDEPENDENT_AMBULATORY_CARE_PROVIDER_SITE_OTHER): Payer: 59

## 2022-05-25 DIAGNOSIS — J309 Allergic rhinitis, unspecified: Secondary | ICD-10-CM

## 2022-06-21 ENCOUNTER — Ambulatory Visit (INDEPENDENT_AMBULATORY_CARE_PROVIDER_SITE_OTHER): Payer: 59 | Admitting: *Deleted

## 2022-06-21 DIAGNOSIS — J309 Allergic rhinitis, unspecified: Secondary | ICD-10-CM

## 2022-07-17 ENCOUNTER — Ambulatory Visit (INDEPENDENT_AMBULATORY_CARE_PROVIDER_SITE_OTHER): Payer: 59

## 2022-07-17 DIAGNOSIS — J309 Allergic rhinitis, unspecified: Secondary | ICD-10-CM | POA: Diagnosis not present

## 2022-09-03 DIAGNOSIS — L659 Nonscarring hair loss, unspecified: Secondary | ICD-10-CM | POA: Diagnosis not present

## 2022-09-03 DIAGNOSIS — R768 Other specified abnormal immunological findings in serum: Secondary | ICD-10-CM | POA: Diagnosis not present

## 2022-09-03 DIAGNOSIS — R8289 Other abnormal findings on cytological and histological examination of urine: Secondary | ICD-10-CM | POA: Diagnosis not present

## 2022-12-27 ENCOUNTER — Other Ambulatory Visit: Payer: Self-pay | Admitting: Family Medicine

## 2022-12-27 DIAGNOSIS — Z30011 Encounter for initial prescription of contraceptive pills: Secondary | ICD-10-CM

## 2023-02-20 ENCOUNTER — Other Ambulatory Visit: Payer: Self-pay | Admitting: Family Medicine

## 2023-02-20 DIAGNOSIS — Z30011 Encounter for initial prescription of contraceptive pills: Secondary | ICD-10-CM

## 2023-03-08 ENCOUNTER — Telehealth: Payer: Self-pay | Admitting: *Deleted

## 2023-03-08 DIAGNOSIS — Z30011 Encounter for initial prescription of contraceptive pills: Secondary | ICD-10-CM

## 2023-03-08 MED ORDER — NORETHIN ACE-ETH ESTRAD-FE 1-20 MG-MCG PO TABS: 1.0000 | ORAL_TABLET | Freq: Every day | ORAL | 1 refills | Status: DC

## 2023-03-08 NOTE — Telephone Encounter (Signed)
Rx done. 

## 2023-05-08 ENCOUNTER — Other Ambulatory Visit: Payer: Self-pay | Admitting: Family Medicine

## 2023-05-08 DIAGNOSIS — Z30011 Encounter for initial prescription of contraceptive pills: Secondary | ICD-10-CM

## 2023-05-09 NOTE — Telephone Encounter (Signed)
 Pt has not been seen in >1 year, please schedule an appt then ok to fill enough until her appointment date

## 2023-05-10 NOTE — Telephone Encounter (Signed)
 Spoke with the patient and informed her of the message below.  Appt scheduled for 1/24 and the patient is aware a refill was sent to the pharmacy.

## 2023-05-31 ENCOUNTER — Encounter: Payer: Self-pay | Admitting: Family Medicine

## 2023-05-31 ENCOUNTER — Ambulatory Visit: Payer: No Typology Code available for payment source | Admitting: Family Medicine

## 2023-05-31 VITALS — BP 108/80 | HR 100 | Temp 98.7°F | Ht <= 58 in | Wt 86.2 lb

## 2023-05-31 DIAGNOSIS — Z0000 Encounter for general adult medical examination without abnormal findings: Secondary | ICD-10-CM | POA: Diagnosis not present

## 2023-05-31 DIAGNOSIS — D508 Other iron deficiency anemias: Secondary | ICD-10-CM

## 2023-05-31 DIAGNOSIS — Z30011 Encounter for initial prescription of contraceptive pills: Secondary | ICD-10-CM

## 2023-05-31 DIAGNOSIS — Z23 Encounter for immunization: Secondary | ICD-10-CM

## 2023-05-31 DIAGNOSIS — E559 Vitamin D deficiency, unspecified: Secondary | ICD-10-CM

## 2023-05-31 MED ORDER — NORETHIN ACE-ETH ESTRAD-FE 1-20 MG-MCG PO TABS: 1.0000 | ORAL_TABLET | Freq: Every day | ORAL | 11 refills | Status: DC

## 2023-05-31 NOTE — Progress Notes (Unsigned)
Complete physical exam  Patient: Marissa Diaz   DOB: 28-Apr-1996   27 y.o. Female  MRN: [id number]  Subjective:    Chief Complaint  Patient presents with   Medical Management of Chronic Issues    Percy Comp is a 28 y.o. female who presents today for a complete physical exam. She reports consuming a general diet. The patient does not participate in regular exercise at present. She generally feels well. She reports sleeping well. She does not have additional problems to discuss today.    Most recent fall risk assessment:     No data to display           Most recent depression screenings:    05/31/2023    3:13 PM 01/24/2022    8:24 AM  PHQ 2/9 Scores  PHQ - 2 Score 0 0  PHQ- 9 Score  0    Vision:Within last year and Dental: No current dental problems and Receives regular dental care  Patient Active Problem List   Diagnosis Date Noted   Contraception management 01/24/2022   Alopecia 01/24/2022   Iron deficiency anemia 01/24/2022   Vitamin D deficiency 12/04/2021   Allergic rhinoconjunctivitis 01/26/2015      Patient Care Team: Karie Georges, MD as PCP - General (Family Medicine) Lucie Leather, Alvira Philips, MD as Consulting Physician (Allergy and Immunology)   Outpatient Medications Prior to Visit  Medication Sig   BLACK CURRANT SEED OIL PO Take by mouth daily.   EPINEPHrine 0.3 mg/0.3 mL IJ SOAJ injection See admin instructions.   EPINEPHrine 0.3 mg/0.3 mL IJ SOAJ injection Inject 0.3 mg into the muscle as needed.   Multiple Vitamin (MULTI VITAMIN) TABS 1 tablet   NON FORMULARY Allergy injections-treated by Dr Lucie Leather   OVER THE COUNTER MEDICATION Ultimate Omega-2 soft gels daily   OVER THE COUNTER MEDICATION Fiber supplement   [DISCONTINUED] norethindrone-ethinyl estradiol-FE (JUNEL FE 1/20) 1-20 MG-MCG tablet TAKE 1 TABLET BY MOUTH EVERY DAY   [DISCONTINUED] Vitamin D, Ergocalciferol, (DRISDOL) 1.25 MG (50000 UNIT) CAPS capsule Take 1 capsule (50,000  Units total) by mouth every 7 (seven) days.   No facility-administered medications prior to visit.    Review of Systems  HENT:  Negative for hearing loss.   Eyes:  Negative for blurred vision.  Respiratory:  Negative for shortness of breath.   Cardiovascular:  Negative for chest pain.  Gastrointestinal: Negative.   Genitourinary: Negative.   Musculoskeletal:  Negative for back pain.  Neurological:  Negative for headaches.  Psychiatric/Behavioral:  Negative for depression.        Objective:     BP 108/80   Pulse 100   Temp 98.7 F (37.1 C) (Oral)   Ht 4' 8.5" (1.435 m)   Wt 86 lb 3.2 oz (39.1 kg)   LMP 05/10/2023 (Approximate)   SpO2 99%   BMI 18.99 kg/m  {Vitals History (Optional):23777}  Physical Exam Vitals reviewed.  Constitutional:      Appearance: Normal appearance. She is well-groomed and normal weight.  HENT:     Right Ear: Tympanic membrane and ear canal normal.     Left Ear: Tympanic membrane and ear canal normal.     Mouth/Throat:     Mouth: Mucous membranes are moist.     Pharynx: No posterior oropharyngeal erythema.  Eyes:     Conjunctiva/sclera: Conjunctivae normal.  Neck:     Thyroid: No thyromegaly.  Cardiovascular:     Rate and Rhythm: Normal rate and regular rhythm.  Pulses: Normal pulses.     Heart sounds: S1 normal and S2 normal.  Pulmonary:     Effort: Pulmonary effort is normal.     Breath sounds: Normal breath sounds and air entry.  Abdominal:     General: Abdomen is flat. Bowel sounds are normal.     Palpations: Abdomen is soft.  Musculoskeletal:     Right lower leg: No edema.     Left lower leg: No edema.  Lymphadenopathy:     Cervical: No cervical adenopathy.  Neurological:     Mental Status: She is alert and oriented to person, place, and time. Mental status is at baseline.     Gait: Gait is intact.  Psychiatric:        Mood and Affect: Mood and affect normal.        Speech: Speech normal.        Behavior: Behavior  normal.        Judgment: Judgment normal.      No results found for any visits on 05/31/23. {Show previous labs (optional):23779}    Assessment & Plan:    Routine Health Maintenance and Physical Exam  Immunization History  Administered Date(s) Administered   DTaP 01/31/1998, 04/05/1998, 06/10/1998, 11/23/1999, 12/07/2002   HIB (PRP-OMP) 01/31/1998, 04/05/1998, 11/23/1999   Hepatitis A 08/27/2006, 12/11/2007   Hepatitis B 01/31/1998, 04/05/1998, 11/23/1999   Hpv-Unspecified 01/08/2013, 04/27/2013, 09/01/2013   IPV 01/31/1998, 04/05/1998, 11/23/1999, 12/07/2002   Influenza,inj,Quad PF,6+ Mos 01/24/2022   MMR 02/06/1999, 12/07/2002   Meningococcal Conjugate 09/29/2009   Meningococcal Mcv4o 12/10/2017   PFIZER(Purple Top)SARS-COV-2 Vaccination 07/11/2019, 08/01/2019   PPD Test 05/18/2020   Pneumococcal Conjugate-13 02/06/1999, 11/23/1999   Td 12/10/2017   Varicella 02/06/1999, 08/27/2006    Health Maintenance  Topic Date Due   HIV Screening  Never done   Hepatitis C Screening  Never done   Cervical Cancer Screening (Pap smear)  Never done   INFLUENZA VACCINE  12/06/2022   COVID-19 Vaccine (3 - 2024-25 season) 01/06/2023   DTaP/Tdap/Td (7 - Tdap) 12/11/2027   Pneumococcal Vaccine 41-81 Years old  Completed   HPV VACCINES  Completed    Discussed health benefits of physical activity, and encouraged her to engage in regular exercise appropriate for her age and condition.  Need for influenza vaccination -     Flu vaccine trivalent PF, 6mos and older(Flulaval,Afluria,Fluarix,Fluzone)  Encounter for initial prescription of contraceptive pills -     Norethin Ace-Eth Estrad-FE; Take 1 tablet by mouth daily.  Dispense: 28 tablet; Refill: 11  Other iron deficiency anemia -     Iron, TIBC and Ferritin Panel -     CBC with Differential/Platelet  Vitamin D deficiency -     VITAMIN D 25 Hydroxy (Vit-D Deficiency, Fractures)    No follow-ups on file.     Karie Georges,  MD

## 2023-06-01 LAB — CBC WITH DIFFERENTIAL/PLATELET
Absolute Lymphocytes: 1408 {cells}/uL (ref 850–3900)
Absolute Monocytes: 457 {cells}/uL (ref 200–950)
Basophils Absolute: 28 {cells}/uL (ref 0–200)
Basophils Relative: 0.5 %
Eosinophils Absolute: 11 {cells}/uL — ABNORMAL LOW (ref 15–500)
Eosinophils Relative: 0.2 %
HCT: 39.3 % (ref 35.0–45.0)
Hemoglobin: 12.8 g/dL (ref 11.7–15.5)
MCH: 29.8 pg (ref 27.0–33.0)
MCHC: 32.6 g/dL (ref 32.0–36.0)
MCV: 91.6 fL (ref 80.0–100.0)
MPV: 8.8 fL (ref 7.5–12.5)
Monocytes Relative: 8.3 %
Neutro Abs: 3597 {cells}/uL (ref 1500–7800)
Neutrophils Relative %: 65.4 %
Platelets: 335 10*3/uL (ref 140–400)
RBC: 4.29 10*6/uL (ref 3.80–5.10)
RDW: 12 % (ref 11.0–15.0)
Total Lymphocyte: 25.6 %
WBC: 5.5 10*3/uL (ref 3.8–10.8)

## 2023-06-01 LAB — IRON,TIBC AND FERRITIN PANEL
%SAT: 35 % (ref 16–45)
Ferritin: 11 ng/mL — ABNORMAL LOW (ref 16–154)
Iron: 178 ug/dL (ref 40–190)
TIBC: 503 ug/dL — ABNORMAL HIGH (ref 250–450)

## 2023-06-01 LAB — VITAMIN D 25 HYDROXY (VIT D DEFICIENCY, FRACTURES): Vit D, 25-Hydroxy: 6 ng/mL — ABNORMAL LOW (ref 30–100)

## 2023-06-03 ENCOUNTER — Encounter: Payer: Self-pay | Admitting: Family Medicine

## 2023-06-03 MED ORDER — VITAMIN D (ERGOCALCIFEROL) 1.25 MG (50000 UNIT) PO CAPS: 50000.0000 [IU] | ORAL_CAPSULE | ORAL | 1 refills | Status: DC

## 2023-11-24 ENCOUNTER — Other Ambulatory Visit: Payer: Self-pay | Admitting: Family Medicine

## 2023-11-24 DIAGNOSIS — E559 Vitamin D deficiency, unspecified: Secondary | ICD-10-CM

## 2024-05-13 ENCOUNTER — Other Ambulatory Visit: Payer: Self-pay | Admitting: Family Medicine

## 2024-05-13 DIAGNOSIS — E559 Vitamin D deficiency, unspecified: Secondary | ICD-10-CM

## 2024-05-13 DIAGNOSIS — Z30011 Encounter for initial prescription of contraceptive pills: Secondary | ICD-10-CM

## 2024-06-02 ENCOUNTER — Encounter: Payer: Self-pay | Admitting: Family Medicine

## 2024-06-04 ENCOUNTER — Ambulatory Visit: Payer: Self-pay | Admitting: Family Medicine

## 2024-06-04 ENCOUNTER — Encounter: Payer: Self-pay | Admitting: Family Medicine

## 2024-06-04 VITALS — BP 94/60 | HR 100 | Temp 98.5°F | Ht <= 58 in | Wt 86.0 lb

## 2024-06-04 DIAGNOSIS — Z0000 Encounter for general adult medical examination without abnormal findings: Secondary | ICD-10-CM

## 2024-06-04 DIAGNOSIS — E559 Vitamin D deficiency, unspecified: Secondary | ICD-10-CM

## 2024-06-04 DIAGNOSIS — D508 Other iron deficiency anemias: Secondary | ICD-10-CM

## 2024-06-04 DIAGNOSIS — Z23 Encounter for immunization: Secondary | ICD-10-CM

## 2024-06-04 LAB — CBC WITH DIFFERENTIAL/PLATELET
Basophils Absolute: 0 10*3/uL (ref 0.0–0.1)
Basophils Relative: 0.2 % (ref 0.0–3.0)
Eosinophils Absolute: 0 10*3/uL (ref 0.0–0.7)
Eosinophils Relative: 0.7 % (ref 0.0–5.0)
HCT: 39.8 % (ref 36.0–46.0)
Hemoglobin: 13.1 g/dL (ref 12.0–15.0)
Lymphocytes Relative: 24.1 % (ref 12.0–46.0)
Lymphs Abs: 1.4 10*3/uL (ref 0.7–4.0)
MCHC: 32.9 g/dL (ref 30.0–36.0)
MCV: 90.9 fl (ref 78.0–100.0)
Monocytes Absolute: 0.4 10*3/uL (ref 0.1–1.0)
Monocytes Relative: 6.5 % (ref 3.0–12.0)
Neutro Abs: 4 10*3/uL (ref 1.4–7.7)
Neutrophils Relative %: 68.5 % (ref 43.0–77.0)
Platelets: 315 10*3/uL (ref 150.0–400.0)
RBC: 4.38 Mil/uL (ref 3.87–5.11)
RDW: 13.3 % (ref 11.5–15.5)
WBC: 5.9 10*3/uL (ref 4.0–10.5)

## 2024-06-04 LAB — COMPREHENSIVE METABOLIC PANEL WITH GFR
ALT: 10 U/L (ref 3–35)
AST: 19 U/L (ref 5–37)
Albumin: 4.7 g/dL (ref 3.5–5.2)
Alkaline Phosphatase: 67 U/L (ref 39–117)
BUN: 11 mg/dL (ref 6–23)
CO2: 27 meq/L (ref 19–32)
Calcium: 9.7 mg/dL (ref 8.4–10.5)
Chloride: 104 meq/L (ref 96–112)
Creatinine, Ser: 0.8 mg/dL (ref 0.40–1.20)
GFR: 101.61 mL/min
Glucose, Bld: 85 mg/dL (ref 70–99)
Potassium: 4.1 meq/L (ref 3.5–5.1)
Sodium: 137 meq/L (ref 135–145)
Total Bilirubin: 0.4 mg/dL (ref 0.2–1.2)
Total Protein: 7.9 g/dL (ref 6.0–8.3)

## 2024-06-04 LAB — VITAMIN D 25 HYDROXY (VIT D DEFICIENCY, FRACTURES): VITD: 56.84 ng/mL (ref 30.00–100.00)

## 2024-06-04 NOTE — Progress Notes (Signed)
 "  Complete physical exam  Patient: Marissa Diaz   DOB: 09/01/1995   28 y.o. Female  MRN: [id number]  Subjective:    Chief Complaint  Patient presents with   Medical Management of Chronic Issues    Marissa Diaz is a 29 y.o. female who presents today for a complete physical exam. She reports consuming a general diet. Eats fast food daily, usually nuggets and fries, eats snacks, occasionally eats fruits and veggies, doesn't often have time for breakfast, eats about 2 meals per day.  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 She generally feels well. She reports sleeping well. She does not have additional problems to discuss today.    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   Eyes:  Negative for blurred vision.  Respiratory:  Negative for shortness of breath.   Cardiovascular:  Negative for chest pain.  Gastrointestinal: Negative.   Genitourinary: Negative.   Musculoskeletal:  Negative for back pain.  Neurological:  Negative for headaches.  Psychiatric/Behavioral:  Negative for depression.        Objective:     BP 94/60   Pulse 100   Temp 98.5 F (36.9 C) (Oral)    Ht 4' 8.5 (1.435 m)   Wt 86 lb (39 kg)   LMP 05/21/2024 (Approximate)   SpO2 99%   BMI 18.94 kg/m    Physical Exam Vitals reviewed.  Constitutional:      Appearance: Normal appearance. She is well-groomed and normal weight.  HENT:     Right Ear: Tympanic membrane and ear canal normal.     Left Ear: Tympanic membrane and ear canal normal.     Mouth/Throat:     Mouth: Mucous membranes are moist.     Pharynx: No posterior oropharyngeal erythema.  Eyes:     Conjunctiva/sclera: Conjunctivae normal.  Neck:     Thyroid : No thyromegaly.  Cardiovascular:     Rate and Rhythm: Normal rate and regular rhythm.     Pulses: Normal pulses.     Heart sounds: S1 normal and S2 normal.  Pulmonary:     Effort: Pulmonary effort is normal.     Breath sounds: Normal breath sounds and air entry.  Abdominal:     General: Abdomen is flat. Bowel sounds are normal.     Palpations: Abdomen is soft.  Musculoskeletal:     Right lower leg: No edema.     Left lower leg: No edema.  Lymphadenopathy:     Cervical: No cervical adenopathy.  Neurological:  Mental Status: She is alert and oriented to person, place, and time. Mental status is at baseline.     Gait: Gait is intact.  Psychiatric:        Mood and Affect: Mood and affect normal.        Speech: Speech normal.        Behavior: Behavior normal.        Judgment: Judgment normal.      No results found for any visits on 06/04/24.     Assessment & Plan:    Routine Health Maintenance and Physical Exam  Immunization History  Administered Date(s) Administered   DTaP 01/31/1998, 04/05/1998, 06/10/1998, 11/23/1999, 12/07/2002   Dtap, Unspecified 06/10/1998, 12/07/2002   HIB (PRP-OMP) 01/31/1998, 04/05/1998, 11/23/1999   HIB, Unspecified 01/31/1998, 04/05/1998, 11/23/1999   HPV Quadrivalent 01/08/2013, 04/27/2013, 09/01/2013   Hep B, Unspecified 01/31/1998, 04/05/1998, 11/23/1999   Hepatitis A 08/27/2006, 12/11/2007   Hepatitis A,  Ped/Adol-2 Dose 08/27/2006, 12/11/2007   Hepatitis B 01/31/1998, 04/05/1998, 11/23/1999   Hpv-Unspecified 01/08/2013, 04/27/2013, 09/01/2013   IPV 01/31/1998, 04/05/1998, 11/23/1999, 12/07/2002   Influenza, Seasonal, Injecte, Preservative Fre 02/10/2010, 03/09/2011, 04/28/2012, 05/31/2023, 06/04/2024   Influenza,Quad,Nasal, Live 04/27/2013   Influenza,inj,Quad PF,6+ Mos 04/22/2014, 01/24/2022   MMR 02/06/1999, 12/07/2002   Meningococcal Conjugate 09/29/2009   Meningococcal Mcv4o 12/10/2017   PFIZER(Purple Top)SARS-COV-2 Vaccination 07/11/2019, 08/01/2019   PPD Test 05/18/2020   Pneumococcal Conjugate PCV 7 02/06/1999, 11/23/1999   Pneumococcal Conjugate-13 02/06/1999, 11/23/1999   Polio, Unspecified 12/07/2002   Td 12/10/2017   Tdap 12/11/2007   Varicella 02/06/1999, 08/27/2006    Health Maintenance  Topic Date Due   Cervical Cancer Screening (Pap smear)  Never done   COVID-19 Vaccine (3 - 2025-26 season) 01/06/2024   Hepatitis C Screening  06/04/2025 (Originally 11/21/2015)   HIV Screening  06/04/2025 (Originally 11/20/2012)   DTaP/Tdap/Td (8 - Td or Tdap) 12/11/2027   Pneumococcal Vaccine  Completed   Influenza Vaccine  Completed   HPV VACCINES  Completed   Meningococcal B Vaccine  Aged Out   Hepatitis B Vaccines 19-59 Average Risk  Discontinued    Discussed health benefits of physical activity, and encouraged her to engage in regular exercise appropriate for her age and condition.  Vitamin D  deficiency -     VITAMIN D  25 Hydroxy (Vit-D Deficiency, Fractures); Future  Other iron deficiency anemia -     Iron, TIBC and Ferritin Panel; Future -     CBC with Differential/Platelet; Future  Routine adult health maintenance -     Comprehensive metabolic panel with GFR; Future  Immunization due -     Flu vaccine trivalent PF, 6mos and older(Flulaval,Afluria,Fluarix,Fluzone)    Return for return for pap smear anytime.   General physical exam findings are normal today. I  reviewed the patient's preventative testing, immunizations, and lifestyle habits. I made appropriate recommendations and placed orders for the appropriate tests and/or vaccinations. I counseled the patient on the CDC's recommendations for healthy exercise and diet. I counseled the patient on healthy sleep habits and stress management. Handouts to reinforce the counseling were given at the conclusion of the visit.    Heron CHRISTELLA Sharper, MD      [1]  Outpatient Medications Prior to Visit  Medication Sig   BLACK CURRANT SEED OIL PO Take by mouth daily.   EPINEPHrine  0.3 mg/0.3 mL IJ SOAJ injection See admin instructions.   EPINEPHrine  0.3 mg/0.3 mL IJ SOAJ injection Inject 0.3 mg into the muscle as needed.   Multiple Vitamin (  MULTI VITAMIN) TABS 1 tablet   norethindrone-ethinyl estradiol-FE (JUNEL FE 1/20) 1-20 MG-MCG tablet TAKE 1 TABLET BY MOUTH EVERY DAY   OVER THE COUNTER MEDICATION Ultimate Omega-2 soft gels daily   OVER THE COUNTER MEDICATION Fiber supplement   Vitamin D , Ergocalciferol , (DRISDOL ) 1.25 MG (50000 UNIT) CAPS capsule TAKE 1 CAPSULE (50,000 UNITS TOTAL) BY MOUTH EVERY 7 (SEVEN) DAYS   [DISCONTINUED] NON FORMULARY Allergy  injections-treated by Dr Kozlow   No facility-administered medications prior to visit.   "

## 2024-06-05 ENCOUNTER — Ambulatory Visit: Payer: Self-pay | Admitting: Family Medicine

## 2024-06-05 LAB — IRON,TIBC AND FERRITIN PANEL
%SAT: 29 % (ref 16–45)
Ferritin: 12 ng/mL — ABNORMAL LOW (ref 16–154)
Iron: 161 ug/dL (ref 40–190)
TIBC: 556 ug/dL — ABNORMAL HIGH (ref 250–450)

## 2024-06-24 ENCOUNTER — Ambulatory Visit: Payer: Self-pay | Admitting: Family Medicine
